# Patient Record
Sex: Female | Born: 1940 | Race: White | Hispanic: No | Smoking: Never smoker
Health system: Southern US, Community
[De-identification: ages and names within clinical notes are randomized; demographics above are authoritative.]

## PROBLEM LIST (undated history)

## (undated) DIAGNOSIS — F419 Anxiety disorder, unspecified: Secondary | ICD-10-CM

## (undated) DIAGNOSIS — I679 Cerebrovascular disease, unspecified: Secondary | ICD-10-CM

## (undated) DIAGNOSIS — M1990 Unspecified osteoarthritis, unspecified site: Secondary | ICD-10-CM

## (undated) DIAGNOSIS — F32A Depression, unspecified: Secondary | ICD-10-CM

## (undated) DIAGNOSIS — C4490 Unspecified malignant neoplasm of skin, unspecified: Secondary | ICD-10-CM

## (undated) DIAGNOSIS — I10 Essential (primary) hypertension: Secondary | ICD-10-CM

## (undated) DIAGNOSIS — C8580 Other specified types of non-Hodgkin lymphoma, unspecified site: Secondary | ICD-10-CM

## (undated) DIAGNOSIS — M51369 Other intervertebral disc degeneration, lumbar region without mention of lumbar back pain or lower extremity pain: Secondary | ICD-10-CM

## (undated) DIAGNOSIS — C8590 Non-Hodgkin lymphoma, unspecified, unspecified site: Secondary | ICD-10-CM

## (undated) DIAGNOSIS — E119 Type 2 diabetes mellitus without complications: Secondary | ICD-10-CM

## (undated) DIAGNOSIS — G250 Essential tremor: Secondary | ICD-10-CM

## (undated) DIAGNOSIS — H3320 Serous retinal detachment, unspecified eye: Secondary | ICD-10-CM

## (undated) DIAGNOSIS — E559 Vitamin D deficiency, unspecified: Secondary | ICD-10-CM

## (undated) DIAGNOSIS — I700 Atherosclerosis of aorta: Secondary | ICD-10-CM

## (undated) DIAGNOSIS — M5136 Other intervertebral disc degeneration, lumbar region: Secondary | ICD-10-CM

## (undated) DIAGNOSIS — G4734 Idiopathic sleep related nonobstructive alveolar hypoventilation: Secondary | ICD-10-CM

## (undated) DIAGNOSIS — E785 Hyperlipidemia, unspecified: Secondary | ICD-10-CM

## (undated) DIAGNOSIS — K5792 Diverticulitis of intestine, part unspecified, without perforation or abscess without bleeding: Secondary | ICD-10-CM

## (undated) DIAGNOSIS — K559 Vascular disorder of intestine, unspecified: Secondary | ICD-10-CM

## (undated) DIAGNOSIS — L719 Rosacea, unspecified: Secondary | ICD-10-CM

## (undated) DIAGNOSIS — R001 Bradycardia, unspecified: Secondary | ICD-10-CM

## (undated) HISTORY — DX: Depression, unspecified: F32.A

## (undated) HISTORY — PX: CATARACT EXTRACTION: SUR2

## (undated) HISTORY — DX: Rosacea, unspecified: L71.9

## (undated) HISTORY — PX: RETINAL DETACHMENT SURGERY: SHX105

## (undated) HISTORY — DX: Type 2 diabetes mellitus without complications: E11.9

## (undated) HISTORY — DX: Atherosclerosis of aorta: I70.0

## (undated) HISTORY — DX: Cerebrovascular disease, unspecified: I67.9

## (undated) HISTORY — DX: Vitamin D deficiency, unspecified: E55.9

## (undated) HISTORY — DX: Idiopathic sleep related nonobstructive alveolar hypoventilation: G47.34

## (undated) HISTORY — DX: Diverticulitis of intestine, part unspecified, without perforation or abscess without bleeding: K57.92

## (undated) HISTORY — DX: Bradycardia, unspecified: R00.1

## (undated) HISTORY — DX: Essential tremor: G25.0

## (undated) HISTORY — DX: Essential (primary) hypertension: I10

## (undated) HISTORY — DX: Unspecified malignant neoplasm of skin, unspecified: C44.90

## (undated) HISTORY — DX: Hyperlipidemia, unspecified: E78.5

## (undated) HISTORY — PX: DILATION AND CURETTAGE OF UTERUS: SHX78

## (undated) HISTORY — DX: Anxiety disorder, unspecified: F41.9

## (undated) HISTORY — DX: Non-Hodgkin lymphoma, unspecified, unspecified site: C85.90

## (undated) HISTORY — DX: Vascular disorder of intestine, unspecified: K55.9

## (undated) HISTORY — DX: Unspecified osteoarthritis, unspecified site: M19.90

## (undated) HISTORY — DX: Other intervertebral disc degeneration, lumbar region without mention of lumbar back pain or lower extremity pain: M51.369

## (undated) HISTORY — DX: Other specified types of non-hodgkin lymphoma, unspecified site: C85.80

## (undated) HISTORY — DX: Serous retinal detachment, unspecified eye: H33.20

## (undated) HISTORY — PX: TONSILLECTOMY AND ADENOIDECTOMY: SHX28

## (undated) HISTORY — DX: Other intervertebral disc degeneration, lumbar region: M51.36

---
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
## 2003-03-02 ENCOUNTER — Encounter: Payer: Self-pay | Admitting: Ophthalmology

## 2003-03-03 ENCOUNTER — Ambulatory Visit (HOSPITAL_COMMUNITY): Admission: RE | Admit: 2003-03-03 | Discharge: 2003-03-03 | Payer: Self-pay | Admitting: Ophthalmology

## 2004-01-05 ENCOUNTER — Ambulatory Visit (HOSPITAL_COMMUNITY): Admission: RE | Admit: 2004-01-05 | Discharge: 2004-01-05 | Payer: Self-pay | Admitting: Ophthalmology

## 2008-09-16 ENCOUNTER — Encounter: Admission: RE | Admit: 2008-09-16 | Discharge: 2008-09-16 | Payer: Self-pay | Admitting: General Surgery

## 2009-10-23 IMAGING — US US ABDOMEN COMPLETE
1 series · 13 of 25 positions shown · non-contrast
Comparison: none

CLINICAL DATA: Abdominal pain; elevated LFTs.  Hypertension.

ABDOMEN ULTRASOUND
TECHNIQUE: Complete abdominal ultrasound examination was performed
including evaluation of the liver, gallbladder, bile ducts,
pancreas, kidneys, spleen, IVC, and abdominal aorta.

[Series 1: us abdomen complete · 0.28mm/px · 13 of 106 slices shown]
[im 1/106]
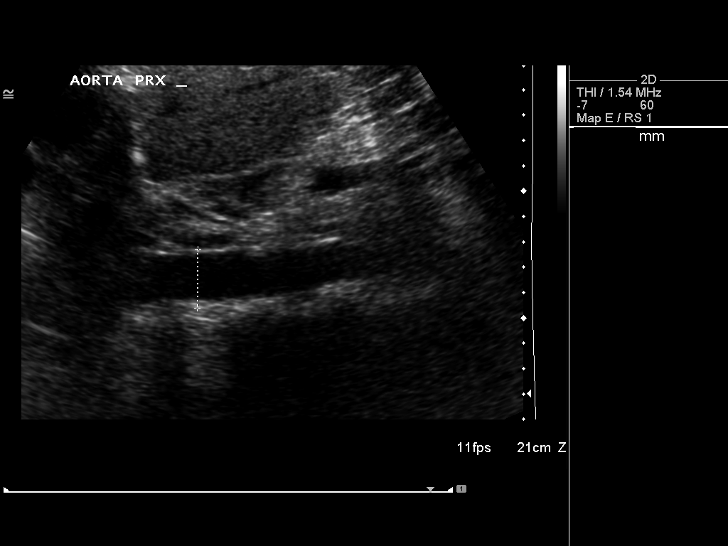
[im 9/106]
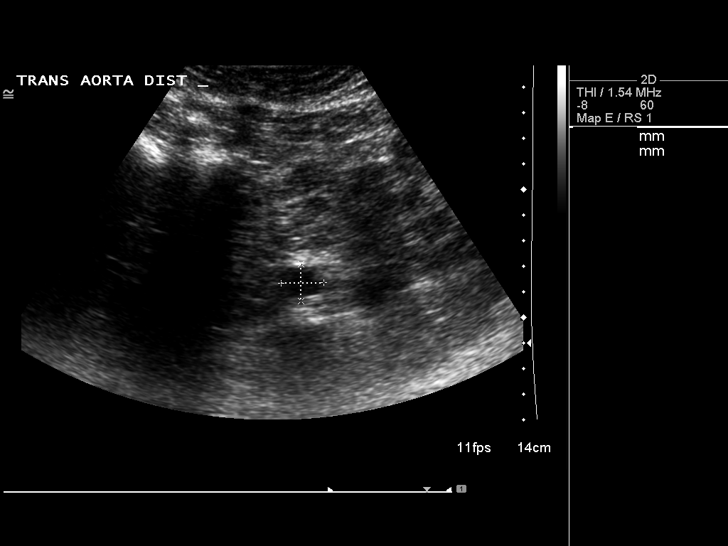
[im 18/106]
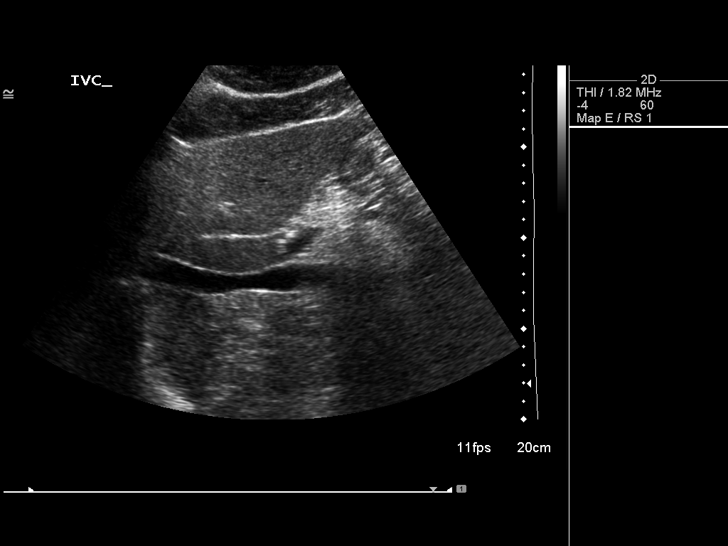
[im 27/106]
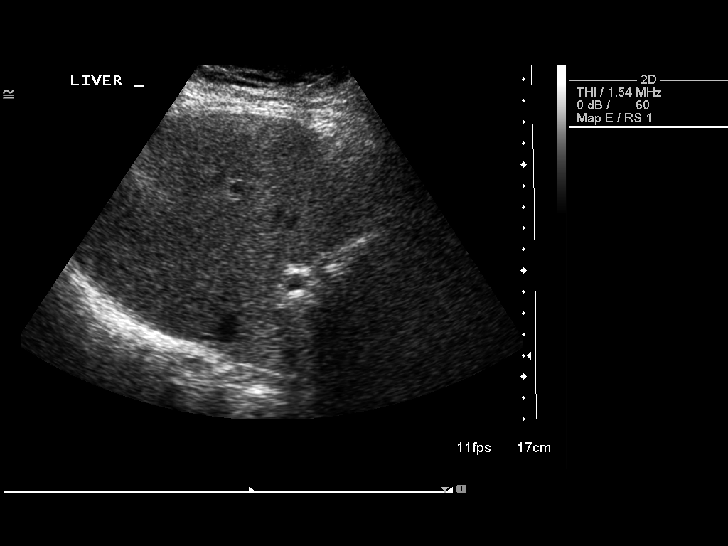
[im 36/106]
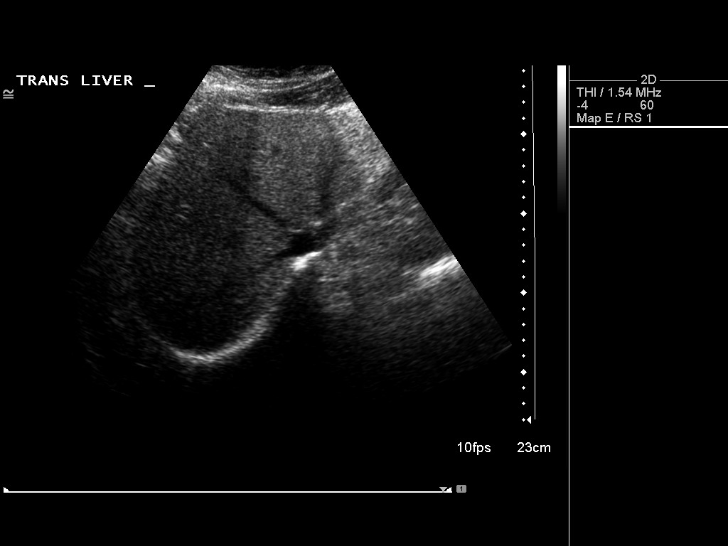
[im 44/106]
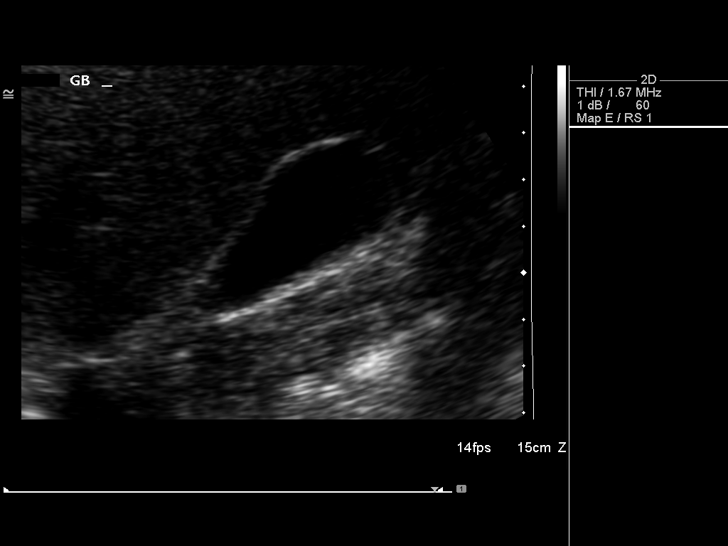
[im 53/106]
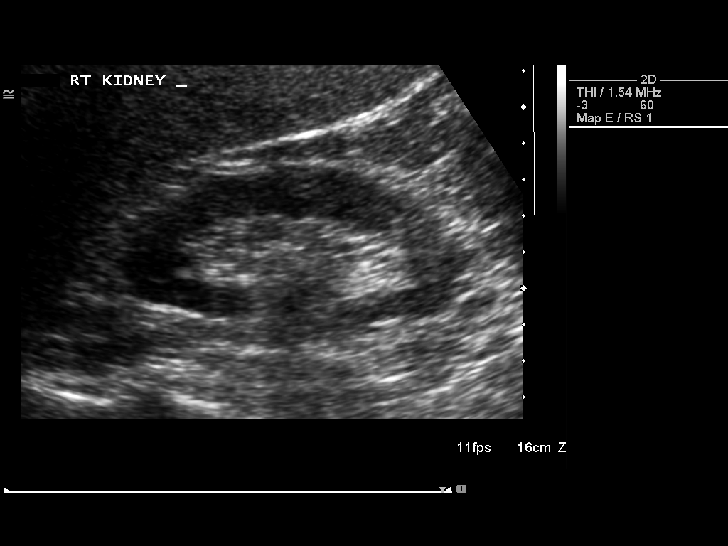
[im 62/106]
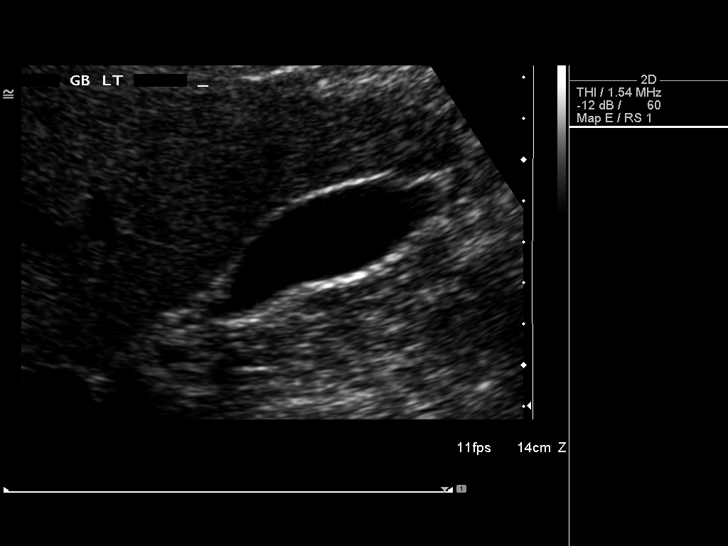
[im 71/106]
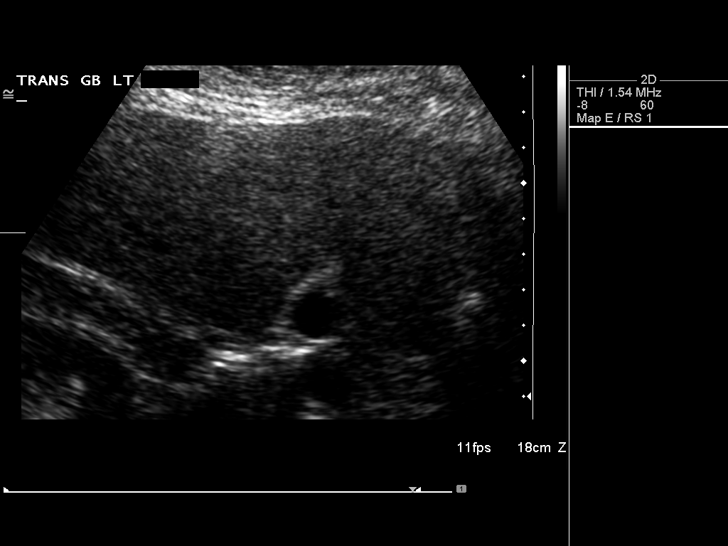
[im 79/106]
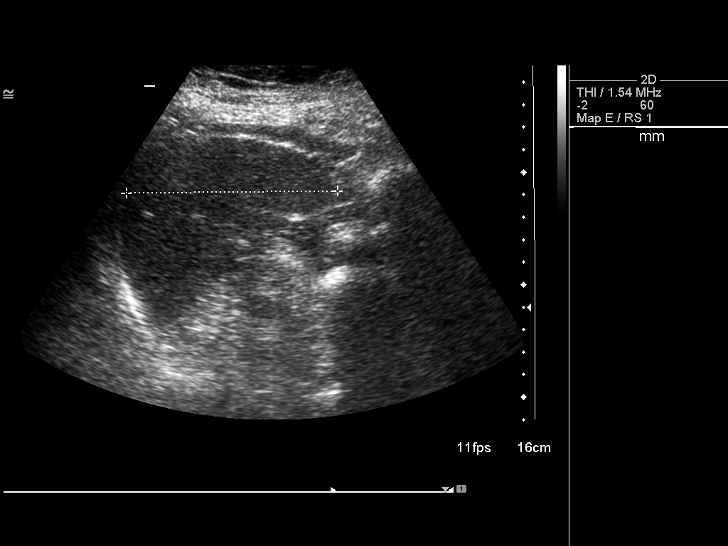
[im 88/106]
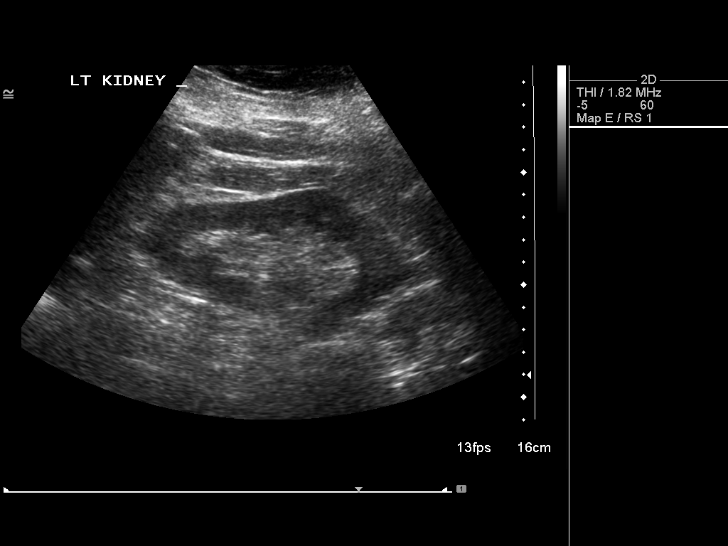
[im 97/106]
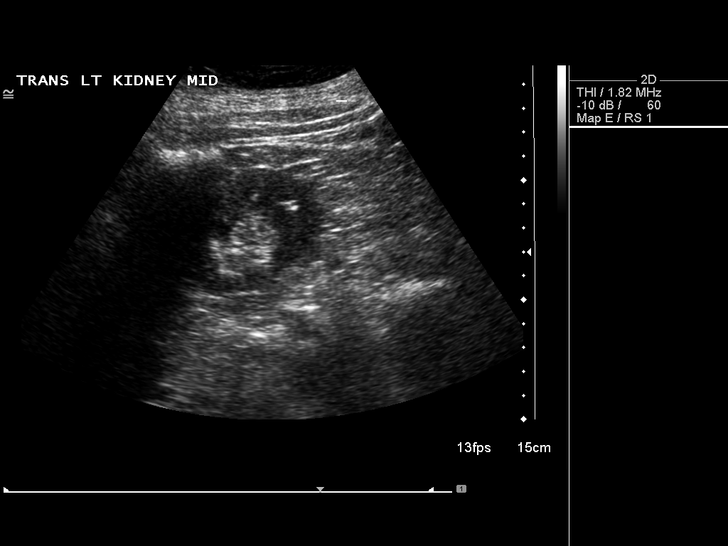
[im 106/106]
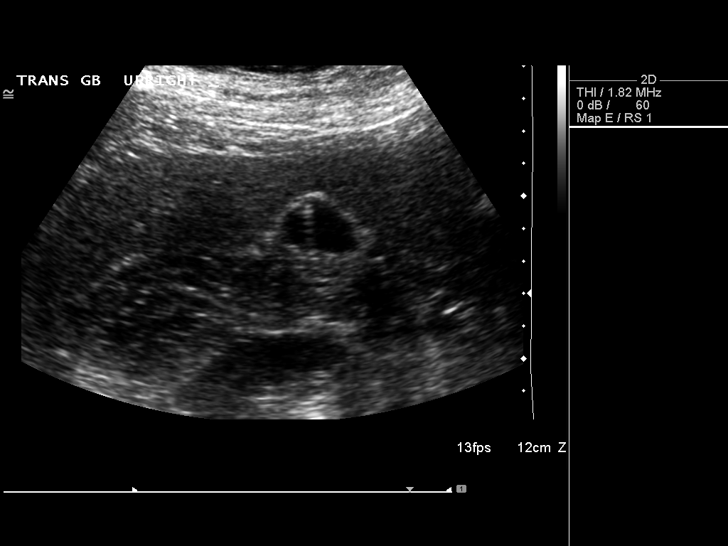

[13 of 25 positions shown; findings below may reference images not displayed]

FINDINGS: No gallstones.  Wall comet tail artifacts are consistent
with adenomyomatosis of the gallbladder.  Airspace septation in the
fundus.  Wall thickness is 1.5 mm.  No biliary ductal dilatation.
Common duct measures 4.4 mm in diameter.  Mild diffuse increase in
hepatic echogenicity is compatible with diffuse hepatocellular
disease.  Slightly limited visualization of the pancreas.  Patent
IVC.  Multiple echogenic foci are noted within the spleen
compatible with granulomata.  Splenic length is 9.4 cm.  The right
and left kidneys measure 9.5 cm and 11.9 cm in length,
respectively.  8 x 8 x 10 mm echogenic focus in the lateral aspect
of the left kidney probably represents a stone.  No hydronephrosis.
The maximum diameter of the abdominal aorta is 2.3 cm.
IMPRESSION: Findings compatible with adenomyomatosis of the gallbladder.
Diffuse hepatocellular disease.  No biliary ductal dilatation.
Probable left renal calculus.

## 2011-01-06 NOTE — Op Note (Signed)
[name], Kerri Garza                             ACCOUNT NO.:  [id number]   MEDICAL RECORD NO.:  [id number]                   PATIENT TYPE:  OIB   LOCATION:  2899                                 FACILITY:  [facility]   PHYSICIAN:  Guadelupe Sabin, M.D.             DATE OF BIRTH:  1941-06-04   DATE OF PROCEDURE:  03/03/2003  DATE OF DISCHARGE:  03/03/2003                                 OPERATIVE REPORT   PREOPERATIVE DIAGNOSIS:  Nuclear cataract left eye.   POSTOPERATIVE DIAGNOSIS:  Nuclear cataract left eye.   OPERATION:  Planned extracapsular cataract extraction--phacoemulsification,  primary insertion of posterior chamber intraocular lens implant.   SURGEON:  Guadelupe Sabin, M.D.   ASSISTANT:  Nurse.   ANESTHESIA:  Local 4% Xylocaine, 0.75 Marcaine retrobulbar block with [place]  added, topical tetracaine, intraocular Xylocaine.  Anesthesia standby  required.  The patient given sodium Pentothal intravenously during the  period of retrobulbar injection.   DESCRIPTION OF PROCEDURE:  After the patient was prepped and draped, the lid  speculum was inserted in the left eye.  The eye was turned downward and a  superior rectus traction suture placed.  A peritomy was performed adjacent  to the limbus from 11 to 1 o'clock position.  The corneoscleral junction was  cleaned and a corneoscleral groove made with a 45 degree Superblade.  The  anterior chamber was then entered with the 2.5 mm diamond keratome at the 12  o'clock position and the 15 degree blade at the 2:30 position.  Using a bent  26 gauge needle on a Healon syringe, a circular capsulorrhexis was begun and  then completed with the Grabow forceps.  Hydrodissection and  hydrodelineation were performed using 1% Xylocaine.  The 30 degrees  phacoemulsification tip was then inserted with slow controlled  emulsification of the lens nucleus.  Total ultrasonic time 30 seconds,  average power levels 16%, total amount of fluid used 85  mL.  Following  removal of the nucleus, the residual cortex was aspirated with the  irrigation aspiration tip.  The posterior capsule appeared intact with a  brilliant red fundus reflex.  Was therefore elected to insert an Aller[employer] SI 40 NB silicone three-piece posterior chamber intraocular  lens implant, diopter strength plus 21.50.  This was inserted with a  McDonald forceps into the anterior chamber and then centered into the  capsular bag using the [facility] lens rotator.  The lens appeared to be well  centered.  The Healon which had been used throughout the procedure was  aspirated and replaced with balanced salt solution and Miochol ophthalmic  solution.  The operative incisions appeared to be self-sealing and no  sutures were required.  Maxitrol ointment was instilled in the conjunctival  cul-de-sac and a light patch and protective shield applied.  Duration of  procedure and anesthesia administration 45 minutes.  The patient tolerated  the  procedure well in general, left the operating room for the recovery room  in good condition.                                               Guadelupe Sabin, M.D.    HNJ/MEDQ  D:  05/01/2003  T:  05/02/2003  Job:  [id number]

## 2011-01-06 NOTE — H&P (Signed)
NAME:  Kerri Garza, Kerri Garza                           ACCOUNT NO.:  [id number]   MEDICAL RECORD NO.:  [id number]                   PATIENT TYPE:  OIB   LOCATION:  2859                                 FACILITY:  [facility]   PHYSICIAN:  Guadelupe Sabin, M.D.             DATE OF BIRTH:  [birth year]   DATE OF ADMISSION:  01/05/2004  DATE OF DISCHARGE:                                HISTORY & PHYSICAL   This was a planned outpatient readmission of this 70 year old female  admitted for cataract implant surgery of the right eye.   HISTORY OF PRESENT ILLNESS:  This patient had previous cataract implant  surgery of the left eye performed on March 03, 2003.  The patient did well  following this surgery and now, due to blurred vision, is ready to proceed  with similar surgery of the unoperated right eye.  She was given oral  discussion and printed information concerning the procedure and its possible  complications. She signed an informed consent, and arrangements were made  for outpatient admission at this time.   PAST MEDICAL HISTORY:  See old chart.   FAMILY HISTORY:  Husband is very sick at the present time.   CURRENT MEDICATIONS:  Aspirin.   PRIMARY CARE PHYSICIAN:  Dr. Malva Limes, [place], [place].   REVIEW OF SYSTEMS:  No cardiorespiratory complaints.   PHYSICAL EXAMINATION:  VITAL SIGNS:  As recorded on admission, blood  pressure 150/81, temperature 97.3, pulse 75, respirations 16.  GENERAL:  The patient is a pleasant, well-nourished, well-developed, 62-year-  old white female in no acute distress.  HEENT:  Eyes:  Visual acuity without correction 20/400 right eye, 20/25 +2  left eye.  Applanation tonometry 12 mm right eye, 20 left eye.  Slit lamp  examination:  The eyes are white and clear with nuclear cataract formation  in the right eye.  The left eye shows a posterior chamber intraocular lens  implant.  The posterior capsule is slightly hazy.  Detailed fundus  examination dilated  shows cataract haze in the right eye, otherwise a clear  vitreous, attached retina, with normal optic nerve, blood vessels, and  macula.   ADMISSION DIAGNOSES:  1. Senile cataract, right eye.  2. Pseudophakia, left eye.   SURGICAL PLAN:  Cataract implant surgery, right eye, with intraocular lens  implantation.                                                Guadelupe Sabin, M.D.    HNJ/MEDQ  D:  01/05/2004  T:  01/05/2004  Job:  [id number]   cc:   Dr. Malva Limes, [facility]

## 2011-01-06 NOTE — Op Note (Signed)
NAME:  Kerri Garza, Kerri Garza                           ACCOUNT NO.:  [id number]   MEDICAL RECORD NO.:  [id number]                   PATIENT TYPE:  OIB   LOCATION:  2859                                 FACILITY:  [facility]   PHYSICIAN:  Guadelupe Sabin, M.D.             DATE OF BIRTH:  [birth year]   DATE OF PROCEDURE:  01/05/2004  DATE OF DISCHARGE:                                 OPERATIVE REPORT   PREOPERATIVE DIAGNOSIS:  Senile cataract, right eye.   POSTOPERATIVE DIAGNOSIS:  Senile cataract, right eye.   OPERATION:  Planned extracapsular cataract extraction --  phacoemulsification, primary insertion of posterior chamber intraocular lens  implant.   SURGEON:  Guadelupe Sabin, M.D.   ASSISTANT:  Nurse.   ANESTHESIA:  Local 4% Xylocaine, 0.75 Marcaine, retrobulbar block, topical  tetracaine, intraocular Xylocaine.  Anesthesia standby required.  Patient  given sodium Pentothal or Ditropan intravenously during the period of  retrobulbar blocking.   DESCRIPTION OF PROCEDURE:  After the patient was prepped and draped, a lid  speculum was inserted in the right eye.  The eye was turned downward and a  superior rectus traction suture placed.  Schiotz tonometry was recorded at 5  scale units with a 5.5 g weight.  A peritomy was performed adjacent to the  limbus from the 11 to 1 o'clock position.  The corneoscleral junction was  cleaned and a corneoscleral groove made with a 45 degree Superblade.  The  anterior chamber was then entered with the 2.5 mm diamond keratome at the 12  o'clock position and the 15 degree blade at the 2:30 position.  Using a bent  26-gauge needle on a Healon syringe, a circular capsulorhexis was begun and  then completed with the Grabow forceps.  Hydrodissection and  hydrodelineation were performed using 1% Xylocaine.  The 30-degree  phacoemulsification tip was then inserted with slow, controlled  emulsification with lens nucleus.  Total ultrasonic time: 44 seconds.  Average power level: 12%.  Total amount of fluid used: 40 ml.  Following  removal of the nucleus, the residual cortex was aspirated with the  irrigation aspiration tip.  The posterior capsule appeared intact with a  brilliant red fundus reflex.  It was, therefore, elected to insert an  Aller[employer] Medical Optics [id number] silicone three-piece posterior chamber  intraocular lens implant, diopter strength +21.50.  This was inserted with  the McDonald forceps into the anterior chamber and then centered into the  capsular bag using the [facility] rotator.  The lens appeared to be well  centered.  The Healon and [place], which had been used intermittently  during the procedure, was aspirated and replaced with balanced salt solution  and Miochol ophthalmic solution.  A single 10-0 nylon suture was used to  close the incision at the 12 o'clock position to ensure closure and prevent  endophthalmitis.  Maxitrol ointment was instilled in the conjunctival cul-de-  sac and  a  light patch and protector shield applied.  Duration of procedure  and anesthesia administration: 45 minutes.  The patient tolerated the  procedure well in general and left the operating room for the recovery room  in good condition.                                               Guadelupe Sabin, M.D.    HNJ/MEDQ  D:  01/05/2004  T:  01/05/2004  Job:  [id number]

## 2011-01-06 NOTE — H&P (Signed)
Kerri Garza, Kerri Garza                             ACCOUNT NO.:  [id number]   MEDICAL RECORD NO.:  [id number]                   PATIENT TYPE:  OIB   LOCATION:  2899                                 FACILITY:  [facility]   PHYSICIAN:  Guadelupe Sabin, M.D.             DATE OF BIRTH:  1941-07-09   DATE OF ADMISSION:  03/03/2003  DATE OF DISCHARGE:  03/03/2003                                HISTORY & PHYSICAL   IDENTIFYING INFORMATION:  This was a planned outpatient surgical admission  of this 70 year old white female admitted for cataract implant surgery of  the left eye.   HISTORY OF PRESENT ILLNESS:  This patient was first seen in my office on  October 13, 1998 complaining of watery vision and glare.  Examination  revealed early nuclear cataract formation in both eyes.  When the patient  returned on December 04, 2002, vision had deteriorated significantly to 20/70  to 20/80 right eye and 20/100 to 20/300 left eye with a refraction and her  old glasses.  Slit-lamp examination showed marked progression in her  cataract formation.  A [place] potential visual acuity reading of 20/20 right  eye, 20/30 left eye indicated that the patient would have probably improved  vision with cataract surgery.  The patient was given oral discussion and  printed information concerning the procedure and its possible complications.  She signed an informed consent and arrangements were made for his outpatient  admission at this time.   PAST MEDICAL HISTORY:  The patient is in stable general health under the  care of her [facility] physicians.  She takes multivitamins and one aspirin  per day.   REVIEW OF SYSTEMS:  No cardiorespiratory complaints.   ALLERGIES:  No known allergies.   PHYSICAL EXAMINATION:  VITAL SIGNS:  As recorded on admission, blood  pressure 137/72, temperature 97.8, heart rate 71, respirations 20.  GENERAL APPEARANCE:  The patient is a pleasant well-nourished, well-  developed white female in  no acute distress.  HEENT:  Eyes:  Visual acuity as noted above.  Applanation tonometry normal  17 mm each eye.  Slit-lamp examination:  The eyes are white and clear with a  clear cornea deep in clear anterior chamber.  Nuclear cataract formation is  present in both eyes.  Detailed dilated fundus examination reveals a clear  vitreous attached retina with normal optic nerve, blood vessels, and macula.  CHEST:  Lungs clear to percussion/auscultation .  HEART:  Normal sinus rhythm.  No cardiomegaly.  No murmurs.  ABDOMEN:  Negative.  EXTREMITIES:  Negative.    ADMISSION DIAGNOSIS:  Senile nuclear cataract both eyes.   SURGICAL PLAN:  Cataract implant surgery left eye now, right eye later.  Guadelupe Sabin, M.D.    HNJ/MEDQ  D:  05/01/2003  T:  05/02/2003  Job:  [id number]

## 2011-08-24 DIAGNOSIS — H33009 Unspecified retinal detachment with retinal break, unspecified eye: Secondary | ICD-10-CM | POA: Insufficient documentation

## 2011-08-24 DIAGNOSIS — Z9889 Other specified postprocedural states: Secondary | ICD-10-CM | POA: Insufficient documentation

## 2011-12-07 DIAGNOSIS — D3130 Benign neoplasm of unspecified choroid: Secondary | ICD-10-CM | POA: Insufficient documentation

## 2011-12-07 DIAGNOSIS — H3320 Serous retinal detachment, unspecified eye: Secondary | ICD-10-CM | POA: Insufficient documentation

## 2012-12-12 DIAGNOSIS — H35379 Puckering of macula, unspecified eye: Secondary | ICD-10-CM | POA: Insufficient documentation

## 2014-12-21 ENCOUNTER — Other Ambulatory Visit (HOSPITAL_COMMUNITY)
Admission: RE | Admit: 2014-12-21 | Discharge: 2014-12-21 | Disposition: A | Discharge status: Home / Self Care | Payer: Medicare Other | Source: Ambulatory Visit | Attending: Oncology | Admitting: Oncology

## 2014-12-21 DIAGNOSIS — D72829 Elevated white blood cell count, unspecified: Secondary | ICD-10-CM | POA: Diagnosis present

## 2015-11-15 ENCOUNTER — Ambulatory Visit: Payer: Self-pay | Admitting: Podiatry

## 2015-11-17 ENCOUNTER — Ambulatory Visit (INDEPENDENT_AMBULATORY_CARE_PROVIDER_SITE_OTHER): Payer: Medicare Other

## 2015-11-17 ENCOUNTER — Encounter: Payer: Self-pay | Admitting: Sports Medicine

## 2015-11-17 ENCOUNTER — Ambulatory Visit (INDEPENDENT_AMBULATORY_CARE_PROVIDER_SITE_OTHER): Payer: Medicare Other | Admitting: Sports Medicine

## 2015-11-17 DIAGNOSIS — M79672 Pain in left foot: Secondary | ICD-10-CM

## 2015-11-17 DIAGNOSIS — M2040 Other hammer toe(s) (acquired), unspecified foot: Secondary | ICD-10-CM

## 2015-11-17 DIAGNOSIS — L84 Corns and callosities: Secondary | ICD-10-CM | POA: Diagnosis not present

## 2015-11-17 NOTE — Progress Notes (Signed)
Patient ID: Kerri Garza, female   DOB: [birth year], 75 y.o.   MRN: [id number] Subjective: Kerri Garza is a 75 y.o. female patient who presents to office for evaluation of Left foot pain. Patient complains of pain at the lesion present Left foot at the 5th toe with radiation sometimes over the top of the toe. Patient has tried guaze spacer and changing shoes with relief in symptoms. Patient states that about 1.5 years ago she was told it was a cyst and was wondering if this was true. Patient denies any other pedal complaints.   Patient Active Problem List   Diagnosis Date Noted  . 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 History of surgical procedure 08/24/2011    No current outpatient prescriptions on file prior to visit.   No current facility-administered medications on file prior to visit.    Allergies  Allergen Reactions  . Moxifloxacin Other (See Comments)    Eye irritation    Objective:  General: Alert and oriented x3 in no acute distress  Dermatology: Keratotic lesion at lateral left medial 5th toe with central nucleated core noted, no webspace macerations, no ecchymosis bilateral, all nails x 10 are well manicured.  Vascular: Dorsalis Pedis and Posterior Tibial pedal pulses 1/4, Capillary Fill Time 3 seconds, no pedal hair growth bilateral, no edema bilateral lower extremities, mild  varicosities, Temperature gradient within normal limits.  Neurology: Johney Maine sensation intact via light touch bilateral.  Musculoskeletal: Mild tenderness with palpation at the lesion site on Left 5th toe with lesser hammertoe and varus rotation, Muscular strength 5/5 in all groups without pain or limitation on range of motion.  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 No other acute findings.   Assessment and Plan: Problem List Items Addressed This Visit    None    Visit Diagnoses    Left foot pain    -  Primary    Relevant Orders    DG Foot 2 Views Left    Corns and callosities        Hammer toe, unspecified laterality           -Complete examination performed -Xrays reviewed -Discussed treatment options for callus at medial left 5th toe secondary to varus hammertoe -Parred keratoic lesion using a chisel blade without incident. Gave patient toe spacer and instructed on use. Patient to wear good supportive shoes for foot type. Advised patient if area becomes inflamed may consider local steroid shot; at this time not indicated.  -Patient to return to office as needed or sooner if condition worsens.  Landis Martins, DPM

## 2015-12-09 DIAGNOSIS — L84 Corns and callosities: Secondary | ICD-10-CM

## 2016-01-31 DIAGNOSIS — C884 Extranodal marginal zone B-cell lymphoma of mucosa-associated lymphoid tissue [MALT-lymphoma]: Secondary | ICD-10-CM | POA: Diagnosis not present

## 2017-03-05 DIAGNOSIS — I10 Essential (primary) hypertension: Secondary | ICD-10-CM | POA: Diagnosis not present

## 2017-03-05 DIAGNOSIS — C884 Extranodal marginal zone B-cell lymphoma of mucosa-associated lymphoid tissue [MALT-lymphoma]: Secondary | ICD-10-CM

## 2018-03-27 DIAGNOSIS — C884 Extranodal marginal zone B-cell lymphoma of mucosa-associated lymphoid tissue [MALT-lymphoma]: Secondary | ICD-10-CM

## 2018-03-27 DIAGNOSIS — R030 Elevated blood-pressure reading, without diagnosis of hypertension: Secondary | ICD-10-CM | POA: Diagnosis not present

## 2018-07-22 ENCOUNTER — Other Ambulatory Visit: Payer: Self-pay | Admitting: *Deleted

## 2018-07-22 DIAGNOSIS — R42 Dizziness and giddiness: Secondary | ICD-10-CM

## 2018-08-26 ENCOUNTER — Ambulatory Visit (INDEPENDENT_AMBULATORY_CARE_PROVIDER_SITE_OTHER): Payer: Medicare Other

## 2018-08-26 DIAGNOSIS — R42 Dizziness and giddiness: Secondary | ICD-10-CM

## 2018-08-30 ENCOUNTER — Telehealth: Payer: Self-pay | Admitting: *Deleted

## 2018-08-30 NOTE — Telephone Encounter (Signed)
Faxed 48 HR holter monitor report to Dr. Felipa Emory office.

## 2018-10-08 ENCOUNTER — Telehealth: Payer: Self-pay | Admitting: *Deleted

## 2018-10-08 NOTE — Telephone Encounter (Signed)
Pt had called to say got a bill from [employer] for her 48 hr monitor and the company said they did not have her insurance information. Called [employer] and gave them her insurance information and they stated would file the monitor with pt's insurance. Advised pt and apologized for the inconvenience.

## 2018-10-10 ENCOUNTER — Telehealth: Payer: Self-pay | Admitting: *Deleted

## 2018-10-10 NOTE — Telephone Encounter (Signed)
Faxed monitor report to Dr. Laqueta Due, ordering provider.

## 2018-10-17 ENCOUNTER — Ambulatory Visit: Payer: Medicare Other | Admitting: Neurology

## 2018-11-14 ENCOUNTER — Ambulatory Visit: Payer: Medicare Other | Admitting: Neurology

## 2019-01-08 ENCOUNTER — Telehealth: Payer: Self-pay | Admitting: *Deleted

## 2019-01-08 ENCOUNTER — Other Ambulatory Visit: Payer: Self-pay

## 2019-01-08 ENCOUNTER — Ambulatory Visit (INDEPENDENT_AMBULATORY_CARE_PROVIDER_SITE_OTHER): Payer: Medicare Other | Admitting: Neurology

## 2019-01-08 ENCOUNTER — Encounter: Payer: Self-pay | Admitting: Neurology

## 2019-01-08 VITALS — HR 78 | Temp 98.0°F | Ht 62.0 in | Wt 187.0 lb

## 2019-01-08 DIAGNOSIS — G1221 Amyotrophic lateral sclerosis: Secondary | ICD-10-CM

## 2019-01-08 DIAGNOSIS — R269 Unspecified abnormalities of gait and mobility: Secondary | ICD-10-CM | POA: Diagnosis not present

## 2019-01-08 DIAGNOSIS — E538 Deficiency of other specified B group vitamins: Secondary | ICD-10-CM

## 2019-01-08 DIAGNOSIS — W19XXXA Unspecified fall, initial encounter: Secondary | ICD-10-CM

## 2019-01-08 DIAGNOSIS — G1229 Other motor neuron disease: Secondary | ICD-10-CM

## 2019-01-08 DIAGNOSIS — R413 Other amnesia: Secondary | ICD-10-CM

## 2019-01-08 DIAGNOSIS — G4733 Obstructive sleep apnea (adult) (pediatric): Secondary | ICD-10-CM | POA: Diagnosis not present

## 2019-01-08 DIAGNOSIS — M4712 Other spondylosis with myelopathy, cervical region: Secondary | ICD-10-CM

## 2019-01-08 DIAGNOSIS — R2689 Other abnormalities of gait and mobility: Secondary | ICD-10-CM | POA: Diagnosis not present

## 2019-01-08 DIAGNOSIS — R270 Ataxia, unspecified: Secondary | ICD-10-CM

## 2019-01-08 NOTE — Telephone Encounter (Signed)
I called Dr. Felipa Emory office and spoke with Denman George. I requested the latest sleep study/notes/exam they have to be faxed over to our office per Dr. Cathren Laine request. They do not have any records for sleep. She stated to try [facility] as it looks like she has seen them for sleep 2012. I called [facility] and spoke with Vicente Males. She said all they had was a referral from 2007 from [place] but they never had the actual consult.

## 2019-01-08 NOTE — Telephone Encounter (Signed)
Thanks, I have Kerri Garza on it too.

## 2019-01-08 NOTE — Patient Instructions (Addendum)
Sleep Evaluation: We will call you to schedule and appointment with our sleep doctor Dr. Brett Fairy MRI of the cervical spine due to imbalance Blood work today May consider formal memory testing in the future  Memory Compensation Strategies  1. Use "WARM" strategy.  W= write it down  A= associate it  R= repeat it  M= make a mental note  2.   You can keep a Social worker.  Use a 3-ring notebook with sections for the following: calendar, important names and phone numbers,  medications, doctors' names/phone numbers, lists/reminders, and a section to journal what you did  each day.   3.    Use a calendar to write appointments down.  4.    Write yourself a schedule for the day.  This can be placed on the calendar or in a separate section of the Memory Notebook.  Keeping a  regular schedule can help memory.  5.    Use medication organizer with sections for each day or morning/evening pills.  You may need help loading it  6.    Keep a basket, or pegboard by the door.  Place items that you need to take out with you in the basket or on the pegboard.  You may also want to  include a message board for reminders.  7.    Use sticky notes.  Place sticky notes with reminders in a place where the task is performed.  For example: " turn off the  stove" placed by the stove, "lock the door" placed on the door at eye level, " take your medications" on  the bathroom mirror or by the place where you normally take your medications.  8.    Use alarms/timers.  Use while cooking to remind yourself to check on food or as a reminder to take your medicine, or as a  reminder to make a call, or as a reminder to perform another task, etc.   Sleep Apnea Sleep apnea affects breathing during sleep. It causes breathing to stop for a short time or to become shallow. It can also increase the risk of:  Heart attack.  Stroke.  Being very overweight (obese).  Diabetes.  Heart failure.  Irregular  heartbeat. The goal of treatment is to help you breathe normally again. What are the causes? There are three kinds of sleep apnea:  Obstructive sleep apnea. This is caused by a blocked or collapsed airway.  Central sleep apnea. This happens when the brain does not send the right signals to the muscles that control breathing.  Mixed sleep apnea. This is a combination of obstructive and central sleep apnea. The most common cause of this condition is a collapsed or blocked airway. This can happen if:  Your throat muscles are too relaxed.  Your tongue and tonsils are too large.  You are overweight.  Your airway is too small. What increases the risk?  Being overweight.  Smoking.  Having a small airway.  Being older.  Being female.  Drinking alcohol.  Taking medicines to calm yourself (sedatives or tranquilizers).  Having family members with the condition. What are the signs or symptoms?  Trouble staying asleep.  Being sleepy or tired during the day.  Getting angry a lot.  Loud snoring.  Headaches in the morning.  Not being able to focus your mind (concentrate).  Forgetting things.  Less interest in sex.  Mood swings.  Personality changes.  Feelings of sadness (depression).  Waking up a lot during the night to  pee (urinate).  Dry mouth.  Sore throat. How is this diagnosed?  Your medical history.  A physical exam.  A test that is done when you are sleeping (sleep study). The test is most often done in a sleep lab but may also be done at home. How is this treated?   Sleeping on your side.  Using a medicine to get rid of mucus in your nose (decongestant).  Avoiding the use of alcohol, medicines to help you relax, or certain pain medicines (narcotics).  Losing weight, if needed.  Changing your diet.  Not smoking.  Using a machine to open your airway while you sleep, such as: ? An oral appliance. This is a mouthpiece that shifts your lower  jaw forward. ? A CPAP device. This device blows air through a mask when you breathe out (exhale). ? An EPAP device. This has valves that you put in each nostril. ? A BPAP device. This device blows air through a mask when you breathe in (inhale) and breathe out.  Having surgery if other treatments do not work. It is important to get treatment for sleep apnea. Without treatment, it can lead to:  High blood pressure.  Coronary artery disease.  In men, not being able to have an erection (impotence).  Reduced thinking ability. Follow these instructions at home: Lifestyle  Make changes that your doctor recommends.  Eat a healthy diet.  Lose weight if needed.  Avoid alcohol, medicines to help you relax, and some pain medicines.  Do not use any products that contain nicotine or tobacco, such as cigarettes, e-cigarettes, and chewing tobacco. If you need help quitting, ask your doctor. General instructions  Take over-the-counter and prescription medicines only as told by your doctor.  If you were given a machine to use while you sleep, use it only as told by your doctor.  If you are having surgery, make sure to tell your doctor you have sleep apnea. You may need to bring your device with you.  Keep all follow-up visits as told by your doctor. This is important. Contact a doctor if:  The machine that you were given to use during sleep bothers you or does not seem to be working.  You do not get better.  You get worse. Get help right away if:  Your chest hurts.  You have trouble breathing in enough air.  You have an uncomfortable feeling in your back, arms, or stomach.  You have trouble talking.  One side of your body feels weak.  A part of your face is hanging down. These symptoms may be an emergency. Do not wait to see if the symptoms will go away. Get medical help right away. Call your local emergency services (911 in the [place] Do not drive yourself to the  hospital. Summary  This condition affects breathing during sleep.  The most common cause is a collapsed or blocked airway.  The goal of treatment is to help you breathe normally while you sleep. This information is not intended to replace advice given to you by your health care provider. Make sure you discuss any questions you have with your health care provider. Document Released: 05/16/2008 Document Revised: 04/02/2018 Document Reviewed: 04/02/2018 Elsevier Interactive Patient Education  [employer].

## 2019-01-08 NOTE — Progress Notes (Signed)
[facility]    Provider:  Dr Jaynee Eagles Requesting Provider: Ernestene Kiel, MD Primary Care Provider:  Ernestene Kiel, MD  CC:  Memory loss, imbalance  HPI:  Kerri Garza is a 78 y.o. female here as requested by Ernestene Kiel, MD for memory loss, imbalance. She has a PMHx of essential hypertension, essential tremor, hyperlipidemia, type 2 diabetes, osteoarthritis of the right knee, obstructive sleep apnea (not on CPAP), unsteady gait, obesity, anxiety, mild depression, cerebrovascular small vessel disease, vitamin D deficiency.. She has not felt well for several years. Dr. Laqueta Due has run "every conceivable test on me". She had her blood-flow in her legs checked, MRI of the brain. She has essential tremor and it can be so bad she can't even write. But her main concern for neurology is her memory, but some mornings she doesn't feel as good. She is not exercising. When she sits sometimes, she wants to think of something and she feels her thinking is slow, can;t think of a person's name but she eventually remembers it. She has untreated sleep apnea, she saw someone on [place]. She takes a nap every day. She feels fatigued sometimes. It is hard for her to get up in the mornings, she does not feel refreshed. She also has depression, there is a lot of things going on with her house, things are on her mind. Her maternal grandmother had dementia but unclear. Mother had a stroke and could not talk so unclear about her mentation. Father had Parkinson's Disease, died at the ages of late 63s. She is an only child. Also balance is poor. No numbness or tingling in the feet. She has problems with her right knee, it needs to be replaced and so she has problems with stairs due to this. She has fallen twice but it was mechanical, she slipped on her deck. She may feel light headed but she doesn't really know cant give me examples, she has never felt pre-syncope or syncope, denies vertigo.  Husband is deceased, lives by herself , she pays her own bills, she never misses bills, she cooks and cleans. She does not eat properly. She shops, she never gets lost. No one has noticed that she has any memory problems, she does not repeat or ask same questions over and over. She calls people from her church, she does not find herself to be too depressed she just feels sad sometimes and overwhelmed with tasks in her home but she lives alone.   Reviewed notes, labs and imaging from outside physicians, which showed:  CBC with differential was normal, CMP was also normal, BUN 16, creatinine 0.85 August 29, 2018.  Review of Systems: Patient complains of symptoms per HPI as well as the following symptoms: joint pain, imbalance, falls, memory loss. Pertinent negatives and positives per HPI. All others negative.   Social History   Socioeconomic History  . Marital status: Widowed    Spouse name: Not on file  . Number of children: 2  . Years of education: 66  . Highest education level: Bachelor's degree (e.g., BA, AB, BS)  Occupational History  . Not on file  Social Needs  . Financial resource strain: Not on file  . Food insecurity:    Worry: Not on file    Inability: Not on file  . Transportation needs:    Medical: Not on file    Non-medical: Not on file  Tobacco Use  . Smoking status: Never Smoker  . Smokeless tobacco: Never Used  Substance  and Sexual Activity  . Alcohol use: Not Currently    Alcohol/week: 0.0 standard drinks    Comment: wine in past, none in years  . Drug use: Never  . Sexual activity: Not on file  Lifestyle  . Physical activity:    Days per week: Not on file    Minutes per session: Not on file  . Stress: Not on file  Relationships  . Social connections:    Talks on phone: Not on file    Gets together: Not on file    Attends religious service: Not on file    Active member of club or organization: Not on file    Attends meetings of clubs or organizations:  Not on file    Relationship status: Not on file  . Intimate partner violence:    Fear of current or ex partner: Not on file    Emotionally abused: Not on file    Physically abused: Not on file    Forced sexual activity: Not on file  Other Topics Concern  . Not on file  Social History Narrative   Lives at home alone   Right handed    Family History  Problem Relation Age of Onset  . High blood pressure Mother   . Stroke Mother   . Parkinson's disease Father   . Dementia Maternal Grandmother   . Cancer Maternal Grandfather        thinks it was pancreatic  . Heart disease Paternal Grandmother   . Suicidality Paternal Grandfather     Past Medical History:  Diagnosis Date  . Lymphoma ([place])    she was diagnosed several years ago for about 2.5 years but then labs were completely normal since then. Received no treatment.  . Retinal detachment    bilateral    Patient Active Problem List   Diagnosis Date Noted  . 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 History of surgical procedure 08/24/2011    Past Surgical History:  Procedure Laterality Date  . CATARACT EXTRACTION Bilateral   . TONSILLECTOMY  1947    Current Outpatient Medications  Medication Sig Dispense Refill  . aspirin 325 MG tablet Take 325 mg by mouth daily.     . Coenzyme Q10 (COQ10) 100 MG CAPS Take 200 mg by mouth daily.    . Ergocalciferol (VITAMIN D2 PO) Take 2,000 Units by mouth daily.     Marland Kitchen FIBER PO Take 2 each by mouth. Fiber well gummies 5 gram each    . fluticasone (FLONASE) 50 MCG/ACT nasal spray Place into both nostrils.    . metroNIDAZOLE (METROCREAM) 0.75 % cream Apply 1 application topically daily.    Marland Kitchen nystatin (MYCOSTATIN/NYSTOP) powder Apply topically as needed. Under breasts    . Omega-3 Fatty Acids (FISH OIL) 1200 MG CAPS Take by mouth.    . TURMERIC CURCUMIN PO Take by mouth.     Marland Kitchen UNABLE TO FIND Take 500 mg by mouth daily. Med Name: veggie caps by healthy origins    . UNKNOWN TO PATIENT Medication she takes as needed for rosacea    . spironolactone (ALDACTONE) 25 MG tablet Take 6.25 mg by mouth daily as needed (swelling).    Kerri Garza 625 MG tablet TAKE THREE TABLETS TWICE DAILY  4   No current facility-administered medications for this visit.     Allergies as of 01/08/2019 - Review Complete 01/08/2019  Allergen Reaction  Noted  . Bactrim ds [sulfamethoxazole-trimethoprim]  01/08/2019  . Lisinopril  01/08/2019  . Losartan potassium  01/08/2019  . Metoprolol  01/08/2019  . Moxifloxacin Other (See Comments) 11/17/2015  . Propranolol  01/08/2019  . Ranitidine hcl  01/08/2019    Vitals: Pulse 78   Temp 98 F (36.7 C) Comment: taken by staff at front upon arrival  Ht 5\' 2"  (1.575 m)   Wt 187 lb (84.8 kg)   LMP  (LMP Unknown)   BMI 34.20 kg/m  Last Weight:  Wt Readings from Last 1 Encounters:  01/08/19 187 lb (84.8 kg)   Last Height:   Ht Readings from Last 1 Encounters:  01/08/19 5\' 2"  (1.575 m)     Physical exam: Exam: Gen: NAD, conversant, well nourised, obese, well groomed                     CV: RRR, no MRG. No Carotid Bruits. No peripheral edema, warm, nontender Eyes: Conjunctivae clear without exudates or hemorrhage  Neuro: Detailed Neurologic Exam  Speech:    Speech is normal; fluent and spontaneous with normal comprehension.  Cognition:  MMSE - Mini Mental State Exam 01/08/2019  Orientation to time 5  Orientation to Place 5  Registration 3  Attention/ Calculation 5  Recall 3  Language- name 2 objects 2  Language- repeat 1  Language- follow 3 step command 3  Language- read & follow direction 1  Write a sentence 1  Copy design 1  Total score 30       The patient is oriented to person, place, and time;     recent and remote memory intact;     language fluent;     normal attention, concentration,     fund of  knowledge Cranial Nerves:    The pupils are equal, round, and reactive to light. Attempted could not visualize fundi due to small pupils. Visual fields are full to finger confrontation. Extraocular movements are intact. Trigeminal sensation is intact and the muscles of mastication are normal. The face is symmetric. The palate elevates in the midline. Hearing intact. Voice is normal. Shoulder shrug is normal. The tongue has normal motion without fasciculations.   Coordination:    Normal finger to nose and heel to shin. Normal rapid alternating movements.   Gait:     Mildly antalgic and right eversion of the foot but good arm swing, normal stance, slightly decreased stride, not shuffling, not ataxic.   Motor Observation:    No asymmetry, no atrophy, high amp postural and action tremor and headtremor Tone:    Normal muscle tone.    Posture:    Posture is normal. normal erect    Strength: Mild LE prox weakness but symmetric. Otherwise strength is V/V in the upper and lower limbs.      Sensation: intact to LT     Reflex Exam:  DTR's:   Hypo AJs otherwise dDeep tendon reflexes in the upper and lower extremities are brisk bilaterally.   Toes:     Right toe upgoing Clonus:    Clonus is absent.    Assessment/Plan:  MALAYAH DEMURO is a 78 y.o. female here as requested by Ernestene Kiel, MD for memory loss, imbalance. She has a PMHx of essential hypertension, essential tremor, hyperlipidemia, type 2 diabetes, osteoarthritis of the right knee, obstructive sleep apnea (not on CPAP), unsteady gait, obesity, anxiety, mild depression, cerebrovascular small vessel disease, vitamin D deficiency.  MRI of the brain was largely  unremarkable with mild to moderate chronic microvascular ischemia which is not out of proportion to her age and vascular risk factors (see above).    Memory loss: Patient is a bit tangential but lovely, she scored a 30 out of 30 on the MMSE.  I suspect her cognitive complaints  are multifactorial including mild depression, sedentary lifestyle without exercise and poor diet, untreated obstructive sleep apnea, and normal cognitive aging. But cannot rule out mild cognitive impairment.  We will continue to follow patient and encourage her to address all these issues and if she continues to feel she is cognitively impaired after addressing the noted issues especially sleep apnea, then we can consider formal neurocognitive testing.  Obstructive sleep apnea: She reports having a sleep test 8 or 10 years ago.  We called Dr. Felipa Emory office and they do not have a record of it.  We also called Dr. Ruben Gottron office in [place] and are waiting to see if they can find it and there "old system".  Patient says she was diagnosed with sleep apnea but declined the CPAP machine.  I explained to her the sequelae of untreated sleep apnea including chronic fatigue, memory loss, headaches, stroke, cardiovascular disease.  Her Epworth sleepiness scale was only a 5 however considering that she was diagnosed in the past many years ago I do recommend that she have a repeat sleep evaluation and treatment.  We will check for common labs today such as B12 and homocystine.  Imbalance: Patient reports imbalance and falls.  Her exam showed very brisk reflexes and upgoing right toe, will order MRI of the cervical spine to evaluate for cervical myelopathy or any other issues in the cervical spine that could be causing her imbalance and clinical findings.  Lifestyle: Declines PT. I did encourage her to try to get some exercise, something that would hurt her knee, possibly stationary bike, water aerobics would be ideal.  Also recommended that she evaluate her diet, possibly a recommendation to a dietitian she can discuss with her primary care.   Orders Placed This Encounter  Procedures  . MR CERVICAL SPINE WO CONTRAST  . B12 and Folate Panel  . Homocysteine  . Methylmalonic acid, serum  . RPR  . Ambulatory  referral to Sleep Studies     Cc: Ernestene Kiel, MD  A total of 90 minutes was spent face-to-face with this patient. Over half this time was spent on counseling patient on the  1. Memory loss   2. Imbalance   3. Obstructive sleep apnea syndrome   4. Abnormality of gait   5. Fall, initial encounter   6. Ataxia   7. Cervical arthritis with myelopathy   8. 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 Screening for B12 deficiency    diagnosis and different diagnostic and therapeutic options, counseling and coordination of care, risks ans benefits of management, compliance, or risk factor reduction and education.    Sarina Ill, MD  [facility] [address] [place], [place] [address]  Phone [phone] Fax [phone]

## 2019-01-09 ENCOUNTER — Telehealth: Payer: Self-pay | Admitting: Neurology

## 2019-01-09 NOTE — Telephone Encounter (Signed)
[employer] medicare order sent to [facility]. No auth they will reach out to the pt to schedule.  °

## 2019-01-11 LAB — METHYLMALONIC ACID, SERUM: Methylmalonic Acid: 182 nmol/L (ref 0–378)

## 2019-01-11 LAB — HOMOCYSTEINE: Homocysteine: 15.6 umol/L (ref 0.0–19.2)

## 2019-01-11 LAB — B12 AND FOLATE PANEL
Folate: 10.6 ng/mL (ref >3.0)
Vitamin B-12: 312 pg/mL (ref 232–1245)

## 2019-01-11 LAB — RPR: RPR Ser Ql: NONREACTIVE

## 2019-01-14 ENCOUNTER — Telehealth: Payer: Self-pay | Admitting: *Deleted

## 2019-01-14 NOTE — Telephone Encounter (Signed)
-----   Message from Melvenia Beam, MD sent at 01/11/2019  2:45 PM EDT ----- Labs are all normal, thanks

## 2019-01-14 NOTE — Telephone Encounter (Signed)
Called pt and LVM (ok per DPR) advising Dr. Jaynee Eagles would like her to have the MRI at [facility]. Advised she prefers for our physicians here to read the imaging. I left the office number in the message and asked for a call back if she has any questions.

## 2019-01-14 NOTE — Telephone Encounter (Signed)
Kerri Garza, just fyi patient reports she was diagnosed with sleep apnea in the past but we cannot find her records, we have called her pcp as well as the facility in [place]. It may have been 10 years ago or so. I will let Dr. Brett Fairy know as well.

## 2019-01-14 NOTE — Telephone Encounter (Signed)
Called pt & let her know her labs are normal. Pt's questions were answered. She would like to have her MRI done in [place].

## 2019-01-14 NOTE — Telephone Encounter (Signed)
I spoke to [facility] & she said that  They don't have any records on the patient.

## 2019-01-14 NOTE — Telephone Encounter (Signed)
Spoke with Dr. Jaynee Eagles. She would like MRI to be read by our neuroradiologists and [place] reads their own. Prefer for pt to have MRI at [facility].

## 2019-01-16 NOTE — Telephone Encounter (Signed)
[facility] used to have a sleep lab and there is a private lab and CPAP company- Dr. Lennox Solders ( spelling ?). Could be either facility.

## 2019-01-17 NOTE — Telephone Encounter (Signed)
Thank you :)

## 2019-01-22 ENCOUNTER — Telehealth: Payer: Self-pay | Admitting: *Deleted

## 2019-01-22 NOTE — Telephone Encounter (Signed)
I gave the sleep study  copy  to [facility] in the sleep lab dept.

## 2019-01-22 NOTE — Telephone Encounter (Signed)
Kerri Garza from [facility] returned my call. She stated to disregard the documents that were sent and that she will now be sending the 2012 sleep study that they have. Pt saw Dr. Alcide Clever @ [facility] in 2012. Confirmed she will send to [phone]. Will give to medical records dept upon receipt.

## 2019-01-22 NOTE — Telephone Encounter (Signed)
We received a progress note from Dr. Felipa Emory office (dated 06/24/2018) along with a sheet from [facility] saying "patient test pending-prescription incomplete or invalid) with no order specified. I called Dr. Felipa Emory office and spoke with phone staff who will relay message to [facility] to look into this.

## 2019-01-22 NOTE — Telephone Encounter (Signed)
Spoke with [facility] @ [facility]. She will fax a sleep study they located from 2012 @ [place]. Pt had seen Dr. Alcide Clever.

## 2019-01-22 NOTE — Telephone Encounter (Signed)
PSG report received from [facility]. Completed @ [facility]. Report given to [facility].

## 2019-01-27 ENCOUNTER — Institutional Professional Consult (permissible substitution): Payer: Medicare Other | Admitting: Neurology

## 2019-02-03 ENCOUNTER — Institutional Professional Consult (permissible substitution): Payer: Medicare Other | Admitting: Neurology

## 2019-02-04 ENCOUNTER — Ambulatory Visit (INDEPENDENT_AMBULATORY_CARE_PROVIDER_SITE_OTHER): Payer: Medicare Other | Admitting: Neurology

## 2019-02-04 ENCOUNTER — Encounter: Payer: Self-pay | Admitting: Neurology

## 2019-02-04 ENCOUNTER — Other Ambulatory Visit: Payer: Self-pay

## 2019-02-04 VITALS — BP 152/70 | HR 76 | Temp 98.2°F | Ht 62.0 in | Wt 187.0 lb

## 2019-02-04 DIAGNOSIS — F329 Major depressive disorder, single episode, unspecified: Secondary | ICD-10-CM | POA: Diagnosis not present

## 2019-02-04 DIAGNOSIS — G478 Other sleep disorders: Secondary | ICD-10-CM | POA: Insufficient documentation

## 2019-02-04 DIAGNOSIS — F32A Depression, unspecified: Secondary | ICD-10-CM

## 2019-02-04 DIAGNOSIS — H3323 Serous retinal detachment, bilateral: Secondary | ICD-10-CM

## 2019-02-04 DIAGNOSIS — R490 Dysphonia: Secondary | ICD-10-CM

## 2019-02-04 DIAGNOSIS — R251 Tremor, unspecified: Secondary | ICD-10-CM | POA: Diagnosis not present

## 2019-02-04 DIAGNOSIS — R400 Somnolence: Secondary | ICD-10-CM

## 2019-02-04 DIAGNOSIS — R5383 Other fatigue: Secondary | ICD-10-CM

## 2019-02-04 NOTE — Patient Instructions (Signed)
Sleep Study    The sleep study consists of a recording of your brain waves (EEG). Breathing, heart rate and rhythm (ECG), oxygen level, eye movement, and leg movement.  The technician will glue or or paste several electrodes to your scalp, face, chest and legs.  You will have belts around your chest and abdomen to record breathing and a finger clasp to check blood oxygen levels.  A tube at your mouth and nose will detect airflow.  There are no needle sticks or painful procedures of any sort.  You will have your own room, and we will make every effort to attend to your comfort and privacy.  Please prepare for your study by the following steps:   Please avoid coffee, tea, soda, chocolate and other caffeine foods or beverages after 12:00 noon on the day of your sleep study.   You must arrive with clean (no oils), conditioners or make up, and please make sure that you wash your hair to ensure that your hair and scalp are clean, dry and free of any hair extensions on the day of your study.  This will help to get a good reading of study.  Please try not to nap on the day of your study.  Please bring a list of all your medications.  Bring any medications that you might need during the time you are within the laboratory, including insulin, sleeping pills, pain medication and anxiety medications.  Bring snacks, water or juice  Please bring clothes to sleep in and your normal overnight bag.  Please leave valuable at home, as we will not be responsible for any lost items.  If you have any further questions, please feel free to call our office. Thank you  **Please call our office 48 in advance to cancel or reschedule to avoid a $100.00 early cancel or no show fee**  

## 2019-02-04 NOTE — Progress Notes (Signed)
SLEEP MEDICINE CLINIC    Provider:  Larey Seat, MD  Primary Care Physician:  Ernestene Kiel, MD [place]. [place] [address]     Referring Provider: Dr Jaynee Eagles        Chief Complaint according to patient   Patient presents with:    . New Patient (Initial Visit)           HISTORY OF PRESENT ILLNESS:  Kerri Garza is a 78 y.o. year old White or Caucasian female patient seen here as a referral on 02/04/2019 from Dr. Jaynee Eagles for a sleep consultation.  She has seen Dr. Jaynee Eagles for perceived memory loss and tremors.    Chief concern according to patient : "I just feel fatigued and not sleepy"   I have the pleasure of seeing Kerri AMBROCIO today, a right -handed White or Caucasian female with a possible sleep disorder.  She has a  has a past medical history of Lymphoma ([place]) and Retinal detachment.. 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   The patient had the first sleep study in the year 2012 by Dr Alcide Clever  with a result of an AHI ( Apnea Hypopnea index)  Unnamed , a RDI ( Respiratory Disturbance Index) of 2/h, an oxygen saturation Nadir remained unnamed.     Sleep relevant medical history: Nocturia/ Enuresis 2 times , Sleep walking: no , Night terrors; no , other Parasomnia ;no ,  Tonsillectomy: at age 60,  cervical spine surgery: no  deviated septum repair/UPPP; no     Family medical /sleep history: she is a single child- no known other family member on CPAP with OSA, insomnia, sleep walkers.  father had PD.    Social history:  Patient is retired from [employer] ( 73)  and lives in a household alone, widowed since 2009.husband had a renal transplant.  . Family status 2 adult sons, born 47 and 79 children, 4 living  grandchildren.  The patient currently works in a sunday school. Tobacco use: never   ETOH use none ,  Caffeine intake in form of Coffee(1 in AM some mornings) Soda( none   ) Tea ( 1-2 a day ), nor energy drinks. Regular exercise in form  of walking   Hobbies outreach through church      Sleep habits are as follows: The patient's dinner time is between 8 PM. The patient goes to bed at 11 PM and has no trouble to initiate sleep, bedroom is cool, quiet but not dark - has a TV in the background. She  continues to sleep for 4 hours, wakes for 2 bathroom breaks, the first time at 3  AM.   The preferred sleep position is the right side , with the support of 2 pillows.  Dreams are reportedly  Frequent/vivid and she dreams of school, her former work.  8  AM is the usual rise time. The patient wakes up swith an alarm.  She reports not feeling refreshed or restored in AM, with symptoms such as dry mouth but no morning headaches and  With a lot of residual fatigue. She sleeps sounder at her sons house.   Naps are taken infrequently, she tries to work through sleepiness.   Review of Systems: Out of a complete 14 system review, the patient complains of only the following symptoms, and all other reviewed systems are negative.:  Fatigue, sleepiness , snoring, fragmented sleep, Insomnia    How likely are you to doze in the following  situations: 0 = not likely, 1 = slight chance, 2 = moderate chance, 3 = high chance   Sitting and Reading? Watching Television? Sitting inactive in a public place (theater or meeting)? As a passenger in a car for an hour without a break? Lying down in the afternoon when circumstances permit? Sitting and talking to someone? Sitting quietly after lunch without alcohol? In a car, while stopped for a few minutes in traffic?   Total = 3/ 24 points   FSS endorsed at 42/ 63 points.   Snoring is not known-  Tremor , tremor noted in writing .   Social History   Socioeconomic History  . Marital status: Widowed    Spouse name: Not on file  . Number of children: 2  . Years of education: 57  . Highest education level: Bachelor's degree (e.g., BA, AB, BS)  Occupational History  . Not on file  Social Needs   . Financial resource strain: Not on file  . Food insecurity    Worry: Not on file    Inability: Not on file  . Transportation needs    Medical: Not on file    Non-medical: Not on file  Tobacco Use  . Smoking status: Never Smoker  . Smokeless tobacco: Never Used  Substance and Sexual Activity  . Alcohol use: Not Currently    Alcohol/week: 0.0 standard drinks    Comment: wine in past, none in years  . Drug use: Never  . Sexual activity: Not on file  Lifestyle  . Physical activity    Days per week: Not on file    Minutes per session: Not on file  . Stress: Not on file  Relationships  . Social Herbalist on phone: Not on file    Gets together: Not on file    Attends religious service: Not on file    Active member of club or organization: Not on file    Attends meetings of clubs or organizations: Not on file    Relationship status: Not on file  Other Topics Concern  . Not on file  Social History Narrative   Lives at home alone   Right handed    Family History  Problem Relation Age of Onset  . High blood pressure Mother   . Stroke Mother   . Parkinson's disease Father   . Dementia Maternal Grandmother   . Cancer Maternal Grandfather        thinks it was pancreatic  . Heart disease Paternal Grandmother   . Suicidality Paternal Grandfather     Past Medical History:  Diagnosis Date  . Lymphoma ([place])    she was diagnosed several years ago for about 2.5 years but then labs were completely normal since then. Received no treatment.  . Retinal detachment    bilateral    Past Surgical History:  Procedure Laterality Date  . CATARACT EXTRACTION Bilateral   . TONSILLECTOMY  1947     Current Outpatient Medications on File Prior to Visit  Medication Sig Dispense Refill  . aspirin 325 MG tablet Take 325 mg by mouth daily.     . Coenzyme Q10 (COQ10) 100 MG CAPS Take 200 mg by mouth daily.    . Ergocalciferol (VITAMIN D2 PO) Take 2,000 Units by mouth daily.      Marland Kitchen FIBER PO Take 2 each by mouth. Fiber well gummies 5 gram each    . fluticasone (FLONASE) 50 MCG/ACT nasal spray Place into both nostrils.    Marland Kitchen  metroNIDAZOLE (METROCREAM) 0.75 % cream Apply 1 application topically daily.    Marland Kitchen nystatin (MYCOSTATIN/NYSTOP) powder Apply topically as needed. Under breasts    . Omega-3 Fatty Acids (FISH OIL) 1200 MG CAPS Take by mouth.    . spironolactone (ALDACTONE) 25 MG tablet Take 6.25 mg by mouth daily as needed (swelling).    . TURMERIC CURCUMIN PO Take by mouth.    Marland Kitchen UNABLE TO FIND Take 500 mg by mouth daily. Med Name: veggie caps by healthy origins    . UNKNOWN TO PATIENT Medication she takes as needed for rosacea    . WELCHOL 625 MG tablet TAKE THREE TABLETS TWICE DAILY  4   No current facility-administered medications on file prior to visit.     Allergies  Allergen Reactions  . Bactrim Ds [Sulfamethoxazole-Trimethoprim]     Irritability & lip swelling  . Lisinopril     URI symptoms  . Losartan Potassium     Fingers felt numb  . Metoprolol     Made her tired  . Moxifloxacin Other (See Comments)    Eye irritation  . Propranolol     Hair loss  . Ranitidine Hcl     Hair loss    Physical exam:  Today's Vitals   02/04/19 0948  BP: (!) 152/70  Pulse: 76  Temp: 98.2 F (36.8 C)  Weight: 187 lb (84.8 kg)  Height: 5\' 2"  (1.575 m)   Body mass index is 34.2 kg/m.   Wt Readings from Last 3 Encounters:  02/04/19 187 lb (84.8 kg)  01/08/19 187 lb (84.8 kg)     Ht Readings from Last 3 Encounters:  02/04/19 5\' 2"  (1.575 m)  01/08/19 5\' 2"  (1.575 m)      General: The patient is awake, alert and appears not in acute distress. The patient is well groomed. Head: Normocephalic, atraumatic. Neck is supple. Mallampati  4 neck circumference:15 inches .  Nasal airflow  patent.  Retrognathia is not  seen. Titubation, mild hand and face tremor.  Dental status: intact.  Cardiovascular:  Regular rate and cardiac rhythm by pulse,  without  distended neck veins. Respiratory: Lungs are clear to auscultation.  Skin:  Without evidence of ankle edema, or rash. Trunk: The patient's posture is erect.   Neurologic exam : The patient is awake and alert, oriented to place and time.   Memory subjective described as impaired.  MMSE - Mini Mental State Exam 01/08/2019  Orientation to time 5  Orientation to Place 5  Registration 3  Attention/ Calculation 5  Recall 3  Language- name 2 objects 2  Language- repeat 1  Language- follow 3 step command 3  Language- read & follow direction 1  Write a sentence 1  Copy design 1  Total score 30   Attention span & concentration ability appears normal.  Speech is fluent,  without  dysarthria, dysphonia or aphasia.  Mood and affect are appropriate.   Cranial nerves: no loss of smell or taste reported  Pupils are equal and briskly reactive to light. Extraocular movements in vertical and horizontal planes were intact and without nystagmus. No Diplopia. Visual fields by finger perimetry are intact. Hearing was intact to soft voice and finger rubbing.    Facial sensation intact to fine touch.  Facial motor strength is symmetric and tongue and uvula move midline.  Neck ROM : rotation, tilt and flexion extension were normal for age and shoulder shrug was symmetrical.    Motor exam:  Symmetric bulk, tone  and ROM.   Normal tone without cog wheeling, symmetric grip strength . Sensory:  Fine touch, pinprick and vibration were tested  and  normal.  Proprioception tested in the upper extremities was normal. Coordination: Rapid alternating movements in the fingers/hands were of normal speed.  The Finger-to-nose maneuver was intact without evidence of ataxia, dysmetria but there is bilateral  tremor.  Gait and station: Patient could rise unassisted from a seated position, walked without assistive device..  Stance is of normal width/ base and the patient turned with 4 steps.  Toe and heel walk were  deferred.  Deep tendon reflexes: in the upper and lower extremities are symmetric and intact.  Babinski response was deferred     After spending a total time of  35  minutes face to face and additional time for physical and neurologic examination, review of laboratory studies,  personal review of imaging studies, reports and results of other testing and review of referral information / records as far as provided in visit, I have established the following assessments:  1) fatigue and sleepiness seem to be influenced by worries, anxiety and especially isolation in Coronavirus times.  2) possisble snoring,  dry mouth  3) sleep hygiene to be improved- she sleeps better at her son's house - I believe that's related  to loneliness.    My Plan is to proceed with:  1) I will screen this patient for sleep apnea, hypoxemia, and  PLms -  2) I handed the patient a insomnia boot camp instruction.   I would like to thank Ernestene Kiel, MD and Ernestene Kiel, [address],  [place] [id number] for allowing me to meet with and to take care of this pleasant patient.   In short, Kerri Garza is presenting with fatigue, sleepiness , a symptom that can be attributed to apnea but also to depression..  I plan to follow up either personally or through our NP within 3  month.   CC: I will share my notes with Dr Jaynee Eagles and Dr Laqueta Due.  .  Electronically signed by: Larey Seat, MD 02/04/2019 10:24 AM  [facility] certified by the [employer] of Sleep Medicine , Diplomate of the [employer] of Sleep Medicine. Board certified In Neurology through the [place],  Fellow of the [employer] of Neurology. Medical Director of [employer].

## 2019-02-26 ENCOUNTER — Other Ambulatory Visit: Payer: Self-pay

## 2019-02-26 ENCOUNTER — Ambulatory Visit (INDEPENDENT_AMBULATORY_CARE_PROVIDER_SITE_OTHER): Payer: Medicare Other | Admitting: Neurology

## 2019-02-26 DIAGNOSIS — R5383 Other fatigue: Secondary | ICD-10-CM

## 2019-02-26 DIAGNOSIS — H3323 Serous retinal detachment, bilateral: Secondary | ICD-10-CM

## 2019-02-26 DIAGNOSIS — R251 Tremor, unspecified: Secondary | ICD-10-CM

## 2019-02-26 DIAGNOSIS — F329 Major depressive disorder, single episode, unspecified: Secondary | ICD-10-CM

## 2019-02-26 DIAGNOSIS — R001 Bradycardia, unspecified: Secondary | ICD-10-CM

## 2019-02-26 DIAGNOSIS — G4733 Obstructive sleep apnea (adult) (pediatric): Secondary | ICD-10-CM

## 2019-02-26 DIAGNOSIS — R400 Somnolence: Secondary | ICD-10-CM

## 2019-02-26 DIAGNOSIS — R490 Dysphonia: Secondary | ICD-10-CM

## 2019-03-05 DIAGNOSIS — R251 Tremor, unspecified: Secondary | ICD-10-CM | POA: Insufficient documentation

## 2019-03-05 DIAGNOSIS — R400 Somnolence: Secondary | ICD-10-CM | POA: Insufficient documentation

## 2019-03-05 DIAGNOSIS — R001 Bradycardia, unspecified: Secondary | ICD-10-CM | POA: Insufficient documentation

## 2019-03-05 DIAGNOSIS — R490 Dysphonia: Secondary | ICD-10-CM | POA: Insufficient documentation

## 2019-03-05 DIAGNOSIS — F32A Depression, unspecified: Secondary | ICD-10-CM | POA: Insufficient documentation

## 2019-03-05 DIAGNOSIS — F329 Major depressive disorder, single episode, unspecified: Secondary | ICD-10-CM | POA: Insufficient documentation

## 2019-03-05 NOTE — Procedures (Signed)
PATIENT'S NAME:  Kerri Garza, Kerri Garza:      Dec 20, 1940      MRN:    [id number]     DATE OF RECORDING: 02/26/2019 REFERRING M.D.:  Sarina Ill MD  Study Performed:   Baseline Polysomnogram HISTORY:  Kerri Garza is a 78 y.o. year old Caucasian right handed female patient seen here upon referral on 02/04/2019 from Dr. Jaynee Eagles for a sleep consultation.  She has seen Dr. Jaynee Eagles for subjectively perceived memory loss and tremors. She reports being fatigued. She goes each night 1-2 times to the bathroom, dreams often vividly about her former workplace, has trouble to initiate sleep and stay asleep. Sleeps with TV in the background.   The patient endorsed the Epworth Sleepiness Scale at 3 points and the FSS at 42 Points. The patient's weight 187 pounds with a height of 62 (inches), resulting in a BMI of 34.5 kg/m2. The patient's neck circumference measured 15 inches.  CURRENT MEDICATIONS: Aspirin, CoQ10, Vitamin D2, Fiber, Mycostatin, fish oil, Aldactone, Turmeric, Welchol, Unknown   PROCEDURE:  This is a multichannel digital polysomnogram utilizing the Somnostar 11.2 system.  Electrodes and sensors were applied and monitored per AASM Specifications.   EEG, EOG, Chin and Limb EMG, were sampled at 200 Hz.  ECG, Snore and Nasal Pressure, Thermal Airflow, Respiratory Effort, CPAP Flow and Pressure, Oximetry was sampled at 50 Hz. Digital video and audio were recorded.      BASELINE STUDY: Lights Out was at 21:43 and Lights On at 04:54.  Total recording time (TRT) was 431.5 minutes, with a total sleep time (TST) of 340.5 minutes.   The patient's sleep latency was 19 minutes.  REM latency was 117.5 minutes.  The sleep efficiency was 78.9 %.     SLEEP ARCHITECTURE: WASO (Wake after sleep onset) was 70 minutes.  There were 22 minutes in Stage N1, 224 minutes Stage N2, 44 minutes Stage N3 and 50.5 minutes in Stage REM.   The percentage of Stage N1 was 6.5%, Stage N2 was 65.8%, Stage N3 was 12.9% and Stage R (REM sleep) was  14.8%.  RESPIRATORY ANALYSIS:  There were a total of 22 respiratory events:  10 obstructive apneas, 0 central apneas and 3 mixed apneas with 9 hypopneas.    The total APNEA/HYPOPNEA INDEX (AHI) was 3.9 /hour.  17 events occurred in REM sleep and 8 events in NREM. The REM AHI was 20.2 /hour, versus a non-REM AHI of 1.0/h.  The patient spent 155 minutes of total sleep time in the supine position and 186 minutes in non-supine.  The supine AHI was 3.4  /h versus a non-supine AHI of 4.2/h.  OXYGEN SATURATION & C02:  The Wake baseline 02 saturation was 96%, with the lowest being 83%. Time spent below 89% saturation equaled 33 minutes. Hypoxia was clustered during REM sleep. Some non -periodic limb movements were also seen during REM sleep.  There was loud snoring noted in NREM 3 stage while the patient was sleeping on the right side.    AROUSALS:  The arousals were noted as: 38 were spontaneous, 0 were associated with PLMs, and 8 were associated with respiratory events. The patient had a total of 0 Periodic Limb Movements  Audio and video analysis did not show any abnormal or unusual movements, complex behaviors, phonations or vocalizations.  Hypoxia was clustered during REM sleep. Some isolated non -periodic limb movements were also seen during REM sleep.  There was loud snoring noted only while in NREM 3 stage and  while the patient was sleeping on the right side.  EKG in normal sinus rhythm (NSR). Nocturia noted once, shortly after midnight.    IMPRESSION:  1. Clinically insignificant overall degree Obstructive Sleep Apnea(OSA) at AHI of 3.9, but in REM sleep exacerbated to 20.2/h. 2. Hypoxemia and sleep apnea where clustered in REM sleep. Total time in hypoxia was 33 minutes and would not allow for intervention.  3. Some sudden movements were noted during REM sleep, but these were isolated, not periodic and would not allow for a diagnosis of REM BD.      RECOMMENDATIONS:  1. There is not  enough apnea here to justify CPAP intervention. I recommend to stay of supine sleep position and consider a snoring aid, such as nasal strips or a dental appliance.      I certify that I have reviewed the entire raw data recording prior to the issuance of this report in accordance with the Standards of Accreditation of the American Academy of Sleep Medicine (AASM)   Larey Seat, MD    03-05-2019  Diplomat, American Board of Psychiatry and Neurology  Diplomat, American Board of [place] Director, [employer] [facility] [employer]

## 2019-03-06 ENCOUNTER — Telehealth: Payer: Self-pay | Admitting: Neurology

## 2019-03-06 NOTE — Telephone Encounter (Signed)
Called the patient and reviewed the sleep study findings. Advised the patient that I would send a copy to her PCP and Dr Jaynee Eagles and send a copy to her in the mail. Pt verbalized understanding.

## 2019-03-06 NOTE — Telephone Encounter (Signed)
-----   Message from Larey Seat, MD sent at 03/05/2019  5:11 PM EDT ----- IMPRESSION:   1. Clinically insignificant overall degree Obstructive Sleep  Apnea(OSA) at AHI of 3.9, but in REM sleep exacerbated to 20.2/h.  2. Hypoxemia and sleep apnea where clustered in REM sleep. Total  time in hypoxia was 33 minutes and would not allow for  intervention.  3. Some sudden movements were noted during REM sleep, but these  were isolated, not periodic and would not allow for a diagnosis  of REM BD.     RECOMMENDATIONS: A follow up with me is optional, as I do not order any therapy or intervention   1. There is not enough apnea here to justify CPAP intervention. I  recommend to stay of supine sleep position and consider a snoring  aid, such as nasal strips or a dental appliance.

## 2019-09-04 DIAGNOSIS — H33001 Unspecified retinal detachment with retinal break, right eye: Secondary | ICD-10-CM | POA: Diagnosis not present

## 2019-09-04 DIAGNOSIS — Z961 Presence of intraocular lens: Secondary | ICD-10-CM | POA: Diagnosis not present

## 2019-09-04 DIAGNOSIS — H33012 Retinal detachment with single break, left eye: Secondary | ICD-10-CM | POA: Diagnosis not present

## 2019-09-04 DIAGNOSIS — D3132 Benign neoplasm of left choroid: Secondary | ICD-10-CM | POA: Diagnosis not present

## 2019-09-04 DIAGNOSIS — H40053 Ocular hypertension, bilateral: Secondary | ICD-10-CM | POA: Diagnosis not present

## 2019-09-04 DIAGNOSIS — H35372 Puckering of macula, left eye: Secondary | ICD-10-CM | POA: Diagnosis not present

## 2019-09-08 DIAGNOSIS — R400 Somnolence: Secondary | ICD-10-CM | POA: Diagnosis not present

## 2019-09-08 DIAGNOSIS — R0902 Hypoxemia: Secondary | ICD-10-CM | POA: Diagnosis not present

## 2019-09-17 DIAGNOSIS — Z6834 Body mass index (BMI) 34.0-34.9, adult: Secondary | ICD-10-CM | POA: Diagnosis not present

## 2019-09-17 DIAGNOSIS — E785 Hyperlipidemia, unspecified: Secondary | ICD-10-CM | POA: Diagnosis not present

## 2019-09-17 DIAGNOSIS — R7301 Impaired fasting glucose: Secondary | ICD-10-CM | POA: Diagnosis not present

## 2019-09-17 DIAGNOSIS — I10 Essential (primary) hypertension: Secondary | ICD-10-CM | POA: Diagnosis not present

## 2019-09-17 DIAGNOSIS — Z1331 Encounter for screening for depression: Secondary | ICD-10-CM | POA: Diagnosis not present

## 2019-10-09 DIAGNOSIS — R0902 Hypoxemia: Secondary | ICD-10-CM | POA: Diagnosis not present

## 2019-10-09 DIAGNOSIS — R400 Somnolence: Secondary | ICD-10-CM | POA: Diagnosis not present

## 2019-10-27 DIAGNOSIS — L309 Dermatitis, unspecified: Secondary | ICD-10-CM | POA: Diagnosis not present

## 2019-11-05 DIAGNOSIS — L578 Other skin changes due to chronic exposure to nonionizing radiation: Secondary | ICD-10-CM | POA: Diagnosis not present

## 2019-11-05 DIAGNOSIS — D225 Melanocytic nevi of trunk: Secondary | ICD-10-CM | POA: Diagnosis not present

## 2019-11-05 DIAGNOSIS — L814 Other melanin hyperpigmentation: Secondary | ICD-10-CM | POA: Diagnosis not present

## 2019-11-05 DIAGNOSIS — D2239 Melanocytic nevi of other parts of face: Secondary | ICD-10-CM | POA: Diagnosis not present

## 2019-11-06 DIAGNOSIS — R0902 Hypoxemia: Secondary | ICD-10-CM | POA: Diagnosis not present

## 2019-11-06 DIAGNOSIS — R400 Somnolence: Secondary | ICD-10-CM | POA: Diagnosis not present

## 2019-12-07 DIAGNOSIS — R400 Somnolence: Secondary | ICD-10-CM | POA: Diagnosis not present

## 2019-12-07 DIAGNOSIS — R0902 Hypoxemia: Secondary | ICD-10-CM | POA: Diagnosis not present

## 2019-12-18 DIAGNOSIS — R3915 Urgency of urination: Secondary | ICD-10-CM | POA: Diagnosis not present

## 2019-12-18 DIAGNOSIS — R519 Headache, unspecified: Secondary | ICD-10-CM | POA: Diagnosis not present

## 2019-12-18 DIAGNOSIS — Z6834 Body mass index (BMI) 34.0-34.9, adult: Secondary | ICD-10-CM | POA: Diagnosis not present

## 2019-12-18 DIAGNOSIS — R197 Diarrhea, unspecified: Secondary | ICD-10-CM | POA: Diagnosis not present

## 2019-12-18 DIAGNOSIS — Z1152 Encounter for screening for COVID-19: Secondary | ICD-10-CM | POA: Diagnosis not present

## 2019-12-18 DIAGNOSIS — M25561 Pain in right knee: Secondary | ICD-10-CM | POA: Diagnosis not present

## 2020-01-06 DIAGNOSIS — R0902 Hypoxemia: Secondary | ICD-10-CM | POA: Diagnosis not present

## 2020-01-06 DIAGNOSIS — R400 Somnolence: Secondary | ICD-10-CM | POA: Diagnosis not present

## 2020-01-14 DIAGNOSIS — R1011 Right upper quadrant pain: Secondary | ICD-10-CM | POA: Diagnosis not present

## 2020-01-14 DIAGNOSIS — R7301 Impaired fasting glucose: Secondary | ICD-10-CM | POA: Diagnosis not present

## 2020-01-14 DIAGNOSIS — R0602 Shortness of breath: Secondary | ICD-10-CM | POA: Diagnosis not present

## 2020-01-14 DIAGNOSIS — R5383 Other fatigue: Secondary | ICD-10-CM | POA: Diagnosis not present

## 2020-01-14 DIAGNOSIS — E785 Hyperlipidemia, unspecified: Secondary | ICD-10-CM | POA: Diagnosis not present

## 2020-01-14 DIAGNOSIS — R351 Nocturia: Secondary | ICD-10-CM | POA: Diagnosis not present

## 2020-02-03 DIAGNOSIS — R0602 Shortness of breath: Secondary | ICD-10-CM | POA: Diagnosis not present

## 2020-02-03 DIAGNOSIS — J984 Other disorders of lung: Secondary | ICD-10-CM | POA: Diagnosis not present

## 2020-02-03 DIAGNOSIS — R079 Chest pain, unspecified: Secondary | ICD-10-CM | POA: Diagnosis not present

## 2020-02-04 DIAGNOSIS — H40053 Ocular hypertension, bilateral: Secondary | ICD-10-CM | POA: Diagnosis not present

## 2020-02-06 DIAGNOSIS — R0902 Hypoxemia: Secondary | ICD-10-CM | POA: Diagnosis not present

## 2020-02-06 DIAGNOSIS — R400 Somnolence: Secondary | ICD-10-CM | POA: Diagnosis not present

## 2020-03-07 DIAGNOSIS — R0902 Hypoxemia: Secondary | ICD-10-CM | POA: Diagnosis not present

## 2020-03-07 DIAGNOSIS — R400 Somnolence: Secondary | ICD-10-CM | POA: Diagnosis not present

## 2020-04-06 DIAGNOSIS — R5383 Other fatigue: Secondary | ICD-10-CM | POA: Diagnosis not present

## 2020-04-06 DIAGNOSIS — M1711 Unilateral primary osteoarthritis, right knee: Secondary | ICD-10-CM | POA: Diagnosis not present

## 2020-04-06 DIAGNOSIS — D692 Other nonthrombocytopenic purpura: Secondary | ICD-10-CM | POA: Diagnosis not present

## 2020-04-06 DIAGNOSIS — Z6834 Body mass index (BMI) 34.0-34.9, adult: Secondary | ICD-10-CM | POA: Diagnosis not present

## 2020-04-07 DIAGNOSIS — R400 Somnolence: Secondary | ICD-10-CM | POA: Diagnosis not present

## 2020-04-07 DIAGNOSIS — R0902 Hypoxemia: Secondary | ICD-10-CM | POA: Diagnosis not present

## 2020-05-08 DIAGNOSIS — R0902 Hypoxemia: Secondary | ICD-10-CM | POA: Diagnosis not present

## 2020-05-08 DIAGNOSIS — R400 Somnolence: Secondary | ICD-10-CM | POA: Diagnosis not present

## 2020-05-13 DIAGNOSIS — D485 Neoplasm of uncertain behavior of skin: Secondary | ICD-10-CM | POA: Diagnosis not present

## 2020-05-13 DIAGNOSIS — D1801 Hemangioma of skin and subcutaneous tissue: Secondary | ICD-10-CM | POA: Diagnosis not present

## 2020-05-13 DIAGNOSIS — L719 Rosacea, unspecified: Secondary | ICD-10-CM | POA: Diagnosis not present

## 2020-05-13 DIAGNOSIS — D225 Melanocytic nevi of trunk: Secondary | ICD-10-CM | POA: Diagnosis not present

## 2020-05-13 DIAGNOSIS — D2239 Melanocytic nevi of other parts of face: Secondary | ICD-10-CM | POA: Diagnosis not present

## 2020-05-31 DIAGNOSIS — R7301 Impaired fasting glucose: Secondary | ICD-10-CM | POA: Diagnosis not present

## 2020-05-31 DIAGNOSIS — Z79899 Other long term (current) drug therapy: Secondary | ICD-10-CM | POA: Diagnosis not present

## 2020-05-31 DIAGNOSIS — I10 Essential (primary) hypertension: Secondary | ICD-10-CM | POA: Diagnosis not present

## 2020-05-31 DIAGNOSIS — Z1231 Encounter for screening mammogram for malignant neoplasm of breast: Secondary | ICD-10-CM | POA: Diagnosis not present

## 2020-05-31 DIAGNOSIS — E785 Hyperlipidemia, unspecified: Secondary | ICD-10-CM | POA: Diagnosis not present

## 2020-05-31 DIAGNOSIS — Z1339 Encounter for screening examination for other mental health and behavioral disorders: Secondary | ICD-10-CM | POA: Diagnosis not present

## 2020-05-31 DIAGNOSIS — Z7189 Other specified counseling: Secondary | ICD-10-CM | POA: Diagnosis not present

## 2020-05-31 DIAGNOSIS — Z0001 Encounter for general adult medical examination with abnormal findings: Secondary | ICD-10-CM | POA: Diagnosis not present

## 2020-05-31 DIAGNOSIS — B372 Candidiasis of skin and nail: Secondary | ICD-10-CM | POA: Diagnosis not present

## 2020-05-31 DIAGNOSIS — Z1331 Encounter for screening for depression: Secondary | ICD-10-CM | POA: Diagnosis not present

## 2020-05-31 DIAGNOSIS — J329 Chronic sinusitis, unspecified: Secondary | ICD-10-CM | POA: Diagnosis not present

## 2020-06-03 DIAGNOSIS — D0461 Carcinoma in situ of skin of right upper limb, including shoulder: Secondary | ICD-10-CM | POA: Diagnosis not present

## 2020-06-07 DIAGNOSIS — R0902 Hypoxemia: Secondary | ICD-10-CM | POA: Diagnosis not present

## 2020-06-07 DIAGNOSIS — R591 Generalized enlarged lymph nodes: Secondary | ICD-10-CM | POA: Diagnosis not present

## 2020-06-07 DIAGNOSIS — R400 Somnolence: Secondary | ICD-10-CM | POA: Diagnosis not present

## 2020-06-21 DIAGNOSIS — L600 Ingrowing nail: Secondary | ICD-10-CM | POA: Diagnosis not present

## 2020-07-08 DIAGNOSIS — R400 Somnolence: Secondary | ICD-10-CM | POA: Diagnosis not present

## 2020-07-08 DIAGNOSIS — R0902 Hypoxemia: Secondary | ICD-10-CM | POA: Diagnosis not present

## 2020-07-26 DIAGNOSIS — R3121 Asymptomatic microscopic hematuria: Secondary | ICD-10-CM | POA: Diagnosis not present

## 2020-08-05 DIAGNOSIS — Z1231 Encounter for screening mammogram for malignant neoplasm of breast: Secondary | ICD-10-CM | POA: Diagnosis not present

## 2020-08-07 DIAGNOSIS — R0902 Hypoxemia: Secondary | ICD-10-CM | POA: Diagnosis not present

## 2020-08-07 DIAGNOSIS — R400 Somnolence: Secondary | ICD-10-CM | POA: Diagnosis not present

## 2020-09-03 DIAGNOSIS — L0291 Cutaneous abscess, unspecified: Secondary | ICD-10-CM | POA: Diagnosis not present

## 2020-09-07 DIAGNOSIS — R400 Somnolence: Secondary | ICD-10-CM | POA: Diagnosis not present

## 2020-09-07 DIAGNOSIS — R0902 Hypoxemia: Secondary | ICD-10-CM | POA: Diagnosis not present

## 2020-10-06 DIAGNOSIS — Z20822 Contact with and (suspected) exposure to covid-19: Secondary | ICD-10-CM | POA: Diagnosis not present

## 2020-10-06 DIAGNOSIS — Z03818 Encounter for observation for suspected exposure to other biological agents ruled out: Secondary | ICD-10-CM | POA: Diagnosis not present

## 2020-10-07 DIAGNOSIS — J019 Acute sinusitis, unspecified: Secondary | ICD-10-CM | POA: Diagnosis not present

## 2020-10-07 DIAGNOSIS — Z1331 Encounter for screening for depression: Secondary | ICD-10-CM | POA: Diagnosis not present

## 2020-10-08 DIAGNOSIS — R0902 Hypoxemia: Secondary | ICD-10-CM | POA: Diagnosis not present

## 2020-10-08 DIAGNOSIS — R400 Somnolence: Secondary | ICD-10-CM | POA: Diagnosis not present

## 2020-10-28 DIAGNOSIS — H33001 Unspecified retinal detachment with retinal break, right eye: Secondary | ICD-10-CM | POA: Diagnosis not present

## 2020-10-28 DIAGNOSIS — H35372 Puckering of macula, left eye: Secondary | ICD-10-CM | POA: Diagnosis not present

## 2020-10-28 DIAGNOSIS — H33012 Retinal detachment with single break, left eye: Secondary | ICD-10-CM | POA: Diagnosis not present

## 2020-10-28 DIAGNOSIS — H40053 Ocular hypertension, bilateral: Secondary | ICD-10-CM | POA: Diagnosis not present

## 2020-10-28 DIAGNOSIS — D3132 Benign neoplasm of left choroid: Secondary | ICD-10-CM | POA: Diagnosis not present

## 2020-11-05 DIAGNOSIS — R0902 Hypoxemia: Secondary | ICD-10-CM | POA: Diagnosis not present

## 2020-11-05 DIAGNOSIS — R400 Somnolence: Secondary | ICD-10-CM | POA: Diagnosis not present

## 2020-11-23 DIAGNOSIS — L814 Other melanin hyperpigmentation: Secondary | ICD-10-CM | POA: Diagnosis not present

## 2020-11-23 DIAGNOSIS — D225 Melanocytic nevi of trunk: Secondary | ICD-10-CM | POA: Diagnosis not present

## 2020-11-23 DIAGNOSIS — D2239 Melanocytic nevi of other parts of face: Secondary | ICD-10-CM | POA: Diagnosis not present

## 2020-12-06 DIAGNOSIS — R400 Somnolence: Secondary | ICD-10-CM | POA: Diagnosis not present

## 2020-12-06 DIAGNOSIS — R0902 Hypoxemia: Secondary | ICD-10-CM | POA: Diagnosis not present

## 2020-12-17 DIAGNOSIS — Z6833 Body mass index (BMI) 33.0-33.9, adult: Secondary | ICD-10-CM | POA: Diagnosis not present

## 2020-12-17 DIAGNOSIS — G250 Essential tremor: Secondary | ICD-10-CM | POA: Diagnosis not present

## 2020-12-17 DIAGNOSIS — M1711 Unilateral primary osteoarthritis, right knee: Secondary | ICD-10-CM | POA: Diagnosis not present

## 2020-12-17 DIAGNOSIS — F418 Other specified anxiety disorders: Secondary | ICD-10-CM | POA: Diagnosis not present

## 2021-01-05 DIAGNOSIS — R400 Somnolence: Secondary | ICD-10-CM | POA: Diagnosis not present

## 2021-01-05 DIAGNOSIS — R0902 Hypoxemia: Secondary | ICD-10-CM | POA: Diagnosis not present

## 2021-01-15 DIAGNOSIS — Z20828 Contact with and (suspected) exposure to other viral communicable diseases: Secondary | ICD-10-CM | POA: Diagnosis not present

## 2021-01-18 DIAGNOSIS — B9689 Other specified bacterial agents as the cause of diseases classified elsewhere: Secondary | ICD-10-CM | POA: Diagnosis not present

## 2021-01-18 DIAGNOSIS — J069 Acute upper respiratory infection, unspecified: Secondary | ICD-10-CM | POA: Diagnosis not present

## 2021-01-18 DIAGNOSIS — J019 Acute sinusitis, unspecified: Secondary | ICD-10-CM | POA: Diagnosis not present

## 2021-02-05 DIAGNOSIS — R0902 Hypoxemia: Secondary | ICD-10-CM | POA: Diagnosis not present

## 2021-02-05 DIAGNOSIS — R400 Somnolence: Secondary | ICD-10-CM | POA: Diagnosis not present

## 2021-02-16 DIAGNOSIS — E785 Hyperlipidemia, unspecified: Secondary | ICD-10-CM | POA: Diagnosis not present

## 2021-02-16 DIAGNOSIS — I10 Essential (primary) hypertension: Secondary | ICD-10-CM | POA: Diagnosis not present

## 2021-02-16 DIAGNOSIS — R7301 Impaired fasting glucose: Secondary | ICD-10-CM | POA: Diagnosis not present

## 2021-02-23 DIAGNOSIS — J310 Chronic rhinitis: Secondary | ICD-10-CM | POA: Diagnosis not present

## 2021-02-23 DIAGNOSIS — R209 Unspecified disturbances of skin sensation: Secondary | ICD-10-CM | POA: Diagnosis not present

## 2021-02-23 DIAGNOSIS — R350 Frequency of micturition: Secondary | ICD-10-CM | POA: Diagnosis not present

## 2021-03-02 DIAGNOSIS — N3000 Acute cystitis without hematuria: Secondary | ICD-10-CM | POA: Diagnosis not present

## 2021-03-07 DIAGNOSIS — R400 Somnolence: Secondary | ICD-10-CM | POA: Diagnosis not present

## 2021-03-07 DIAGNOSIS — R0902 Hypoxemia: Secondary | ICD-10-CM | POA: Diagnosis not present

## 2021-03-25 DIAGNOSIS — L02612 Cutaneous abscess of left foot: Secondary | ICD-10-CM | POA: Diagnosis not present

## 2021-04-07 DIAGNOSIS — R400 Somnolence: Secondary | ICD-10-CM | POA: Diagnosis not present

## 2021-04-07 DIAGNOSIS — R0902 Hypoxemia: Secondary | ICD-10-CM | POA: Diagnosis not present

## 2021-04-27 DIAGNOSIS — Z20822 Contact with and (suspected) exposure to covid-19: Secondary | ICD-10-CM | POA: Diagnosis not present

## 2021-04-27 DIAGNOSIS — B349 Viral infection, unspecified: Secondary | ICD-10-CM | POA: Diagnosis not present

## 2021-04-27 DIAGNOSIS — R070 Pain in throat: Secondary | ICD-10-CM | POA: Diagnosis not present

## 2021-04-29 DIAGNOSIS — R0981 Nasal congestion: Secondary | ICD-10-CM | POA: Diagnosis not present

## 2021-04-29 DIAGNOSIS — R058 Other specified cough: Secondary | ICD-10-CM | POA: Diagnosis not present

## 2021-04-29 DIAGNOSIS — J0110 Acute frontal sinusitis, unspecified: Secondary | ICD-10-CM | POA: Diagnosis not present

## 2021-05-08 DIAGNOSIS — R400 Somnolence: Secondary | ICD-10-CM | POA: Diagnosis not present

## 2021-05-08 DIAGNOSIS — R0902 Hypoxemia: Secondary | ICD-10-CM | POA: Diagnosis not present

## 2021-05-21 DIAGNOSIS — R509 Fever, unspecified: Secondary | ICD-10-CM | POA: Diagnosis not present

## 2021-05-21 DIAGNOSIS — Z20828 Contact with and (suspected) exposure to other viral communicable diseases: Secondary | ICD-10-CM | POA: Diagnosis not present

## 2021-05-30 DIAGNOSIS — I10 Essential (primary) hypertension: Secondary | ICD-10-CM | POA: Diagnosis not present

## 2021-05-30 DIAGNOSIS — E785 Hyperlipidemia, unspecified: Secondary | ICD-10-CM | POA: Diagnosis not present

## 2021-05-30 DIAGNOSIS — R7301 Impaired fasting glucose: Secondary | ICD-10-CM | POA: Diagnosis not present

## 2021-06-03 DIAGNOSIS — I10 Essential (primary) hypertension: Secondary | ICD-10-CM | POA: Diagnosis not present

## 2021-06-03 DIAGNOSIS — E785 Hyperlipidemia, unspecified: Secondary | ICD-10-CM | POA: Diagnosis not present

## 2021-06-03 DIAGNOSIS — Z0000 Encounter for general adult medical examination without abnormal findings: Secondary | ICD-10-CM | POA: Diagnosis not present

## 2021-06-03 DIAGNOSIS — R209 Unspecified disturbances of skin sensation: Secondary | ICD-10-CM | POA: Diagnosis not present

## 2021-06-03 DIAGNOSIS — Z1331 Encounter for screening for depression: Secondary | ICD-10-CM | POA: Diagnosis not present

## 2021-06-03 DIAGNOSIS — R7301 Impaired fasting glucose: Secondary | ICD-10-CM | POA: Diagnosis not present

## 2021-06-07 DIAGNOSIS — R0902 Hypoxemia: Secondary | ICD-10-CM | POA: Diagnosis not present

## 2021-06-07 DIAGNOSIS — R400 Somnolence: Secondary | ICD-10-CM | POA: Diagnosis not present

## 2021-06-13 DIAGNOSIS — D2239 Melanocytic nevi of other parts of face: Secondary | ICD-10-CM | POA: Diagnosis not present

## 2021-06-13 DIAGNOSIS — L814 Other melanin hyperpigmentation: Secondary | ICD-10-CM | POA: Diagnosis not present

## 2021-06-13 DIAGNOSIS — D225 Melanocytic nevi of trunk: Secondary | ICD-10-CM | POA: Diagnosis not present

## 2021-06-13 DIAGNOSIS — D1801 Hemangioma of skin and subcutaneous tissue: Secondary | ICD-10-CM | POA: Diagnosis not present

## 2021-07-01 DIAGNOSIS — H40053 Ocular hypertension, bilateral: Secondary | ICD-10-CM | POA: Diagnosis not present

## 2021-07-05 DIAGNOSIS — J0100 Acute maxillary sinusitis, unspecified: Secondary | ICD-10-CM | POA: Diagnosis not present

## 2021-07-05 DIAGNOSIS — J209 Acute bronchitis, unspecified: Secondary | ICD-10-CM | POA: Diagnosis not present

## 2021-07-05 DIAGNOSIS — R509 Fever, unspecified: Secondary | ICD-10-CM | POA: Diagnosis not present

## 2021-08-06 DIAGNOSIS — Z20822 Contact with and (suspected) exposure to covid-19: Secondary | ICD-10-CM | POA: Diagnosis not present

## 2021-08-08 DIAGNOSIS — Z1231 Encounter for screening mammogram for malignant neoplasm of breast: Secondary | ICD-10-CM | POA: Diagnosis not present

## 2021-09-28 DIAGNOSIS — R7301 Impaired fasting glucose: Secondary | ICD-10-CM | POA: Diagnosis not present

## 2021-09-28 DIAGNOSIS — I10 Essential (primary) hypertension: Secondary | ICD-10-CM | POA: Diagnosis not present

## 2021-09-28 DIAGNOSIS — E785 Hyperlipidemia, unspecified: Secondary | ICD-10-CM | POA: Diagnosis not present

## 2021-10-04 DIAGNOSIS — R7301 Impaired fasting glucose: Secondary | ICD-10-CM | POA: Diagnosis not present

## 2021-10-04 DIAGNOSIS — E785 Hyperlipidemia, unspecified: Secondary | ICD-10-CM | POA: Diagnosis not present

## 2021-10-04 DIAGNOSIS — I10 Essential (primary) hypertension: Secondary | ICD-10-CM | POA: Diagnosis not present

## 2021-10-06 DIAGNOSIS — Z6832 Body mass index (BMI) 32.0-32.9, adult: Secondary | ICD-10-CM | POA: Diagnosis not present

## 2021-10-06 DIAGNOSIS — R7301 Impaired fasting glucose: Secondary | ICD-10-CM | POA: Diagnosis not present

## 2021-10-06 DIAGNOSIS — R209 Unspecified disturbances of skin sensation: Secondary | ICD-10-CM | POA: Diagnosis not present

## 2021-10-06 DIAGNOSIS — M4726 Other spondylosis with radiculopathy, lumbar region: Secondary | ICD-10-CM | POA: Diagnosis not present

## 2021-10-06 DIAGNOSIS — M79641 Pain in right hand: Secondary | ICD-10-CM | POA: Diagnosis not present

## 2021-10-06 DIAGNOSIS — E785 Hyperlipidemia, unspecified: Secondary | ICD-10-CM | POA: Diagnosis not present

## 2021-10-06 DIAGNOSIS — M79642 Pain in left hand: Secondary | ICD-10-CM | POA: Diagnosis not present

## 2021-11-03 DIAGNOSIS — G5602 Carpal tunnel syndrome, left upper limb: Secondary | ICD-10-CM | POA: Diagnosis not present

## 2021-11-03 DIAGNOSIS — G5603 Carpal tunnel syndrome, bilateral upper limbs: Secondary | ICD-10-CM | POA: Diagnosis not present

## 2021-11-03 DIAGNOSIS — G5601 Carpal tunnel syndrome, right upper limb: Secondary | ICD-10-CM | POA: Diagnosis not present

## 2021-11-03 DIAGNOSIS — H33012 Retinal detachment with single break, left eye: Secondary | ICD-10-CM | POA: Diagnosis not present

## 2021-11-03 DIAGNOSIS — H40053 Ocular hypertension, bilateral: Secondary | ICD-10-CM | POA: Diagnosis not present

## 2021-11-03 DIAGNOSIS — H33001 Unspecified retinal detachment with retinal break, right eye: Secondary | ICD-10-CM | POA: Diagnosis not present

## 2021-11-03 DIAGNOSIS — H35372 Puckering of macula, left eye: Secondary | ICD-10-CM | POA: Diagnosis not present

## 2021-11-03 DIAGNOSIS — D3132 Benign neoplasm of left choroid: Secondary | ICD-10-CM | POA: Diagnosis not present

## 2021-12-07 DIAGNOSIS — G5603 Carpal tunnel syndrome, bilateral upper limbs: Secondary | ICD-10-CM | POA: Diagnosis not present

## 2021-12-13 DIAGNOSIS — Z6833 Body mass index (BMI) 33.0-33.9, adult: Secondary | ICD-10-CM | POA: Diagnosis not present

## 2021-12-13 DIAGNOSIS — M533 Sacrococcygeal disorders, not elsewhere classified: Secondary | ICD-10-CM | POA: Diagnosis not present

## 2022-01-02 DIAGNOSIS — M4316 Spondylolisthesis, lumbar region: Secondary | ICD-10-CM | POA: Diagnosis not present

## 2022-01-02 DIAGNOSIS — M47817 Spondylosis without myelopathy or radiculopathy, lumbosacral region: Secondary | ICD-10-CM | POA: Diagnosis not present

## 2022-01-02 DIAGNOSIS — I700 Atherosclerosis of aorta: Secondary | ICD-10-CM | POA: Diagnosis not present

## 2022-01-02 DIAGNOSIS — M545 Low back pain, unspecified: Secondary | ICD-10-CM | POA: Diagnosis not present

## 2022-01-02 DIAGNOSIS — M160 Bilateral primary osteoarthritis of hip: Secondary | ICD-10-CM | POA: Diagnosis not present

## 2022-02-13 DIAGNOSIS — E785 Hyperlipidemia, unspecified: Secondary | ICD-10-CM | POA: Diagnosis not present

## 2022-02-13 DIAGNOSIS — R7301 Impaired fasting glucose: Secondary | ICD-10-CM | POA: Diagnosis not present

## 2022-02-15 DIAGNOSIS — I700 Atherosclerosis of aorta: Secondary | ICD-10-CM | POA: Diagnosis not present

## 2022-02-15 DIAGNOSIS — Z6833 Body mass index (BMI) 33.0-33.9, adult: Secondary | ICD-10-CM | POA: Diagnosis not present

## 2022-02-15 DIAGNOSIS — M5136 Other intervertebral disc degeneration, lumbar region: Secondary | ICD-10-CM | POA: Diagnosis not present

## 2022-02-15 DIAGNOSIS — R5383 Other fatigue: Secondary | ICD-10-CM | POA: Diagnosis not present

## 2022-02-15 DIAGNOSIS — E785 Hyperlipidemia, unspecified: Secondary | ICD-10-CM | POA: Diagnosis not present

## 2022-02-27 DIAGNOSIS — G5603 Carpal tunnel syndrome, bilateral upper limbs: Secondary | ICD-10-CM | POA: Diagnosis not present

## 2022-03-15 DIAGNOSIS — R42 Dizziness and giddiness: Secondary | ICD-10-CM | POA: Diagnosis not present

## 2022-03-15 DIAGNOSIS — R5383 Other fatigue: Secondary | ICD-10-CM | POA: Diagnosis not present

## 2022-03-15 DIAGNOSIS — Z6832 Body mass index (BMI) 32.0-32.9, adult: Secondary | ICD-10-CM | POA: Diagnosis not present

## 2022-05-08 DIAGNOSIS — R42 Dizziness and giddiness: Secondary | ICD-10-CM | POA: Diagnosis not present

## 2022-05-08 DIAGNOSIS — I358 Other nonrheumatic aortic valve disorders: Secondary | ICD-10-CM | POA: Diagnosis not present

## 2022-05-08 DIAGNOSIS — I6523 Occlusion and stenosis of bilateral carotid arteries: Secondary | ICD-10-CM | POA: Diagnosis not present

## 2022-05-09 DIAGNOSIS — M5451 Vertebrogenic low back pain: Secondary | ICD-10-CM | POA: Diagnosis not present

## 2022-05-22 DIAGNOSIS — Z6833 Body mass index (BMI) 33.0-33.9, adult: Secondary | ICD-10-CM | POA: Diagnosis not present

## 2022-05-22 DIAGNOSIS — I10 Essential (primary) hypertension: Secondary | ICD-10-CM | POA: Diagnosis not present

## 2022-05-24 DIAGNOSIS — D225 Melanocytic nevi of trunk: Secondary | ICD-10-CM | POA: Diagnosis not present

## 2022-05-24 DIAGNOSIS — D2239 Melanocytic nevi of other parts of face: Secondary | ICD-10-CM | POA: Diagnosis not present

## 2022-05-24 DIAGNOSIS — L814 Other melanin hyperpigmentation: Secondary | ICD-10-CM | POA: Diagnosis not present

## 2022-05-24 DIAGNOSIS — L570 Actinic keratosis: Secondary | ICD-10-CM | POA: Diagnosis not present

## 2022-05-24 DIAGNOSIS — L821 Other seborrheic keratosis: Secondary | ICD-10-CM | POA: Diagnosis not present

## 2022-06-29 DIAGNOSIS — R635 Abnormal weight gain: Secondary | ICD-10-CM | POA: Diagnosis not present

## 2022-06-29 DIAGNOSIS — R7301 Impaired fasting glucose: Secondary | ICD-10-CM | POA: Diagnosis not present

## 2022-06-29 DIAGNOSIS — I10 Essential (primary) hypertension: Secondary | ICD-10-CM | POA: Diagnosis not present

## 2022-06-29 DIAGNOSIS — M2550 Pain in unspecified joint: Secondary | ICD-10-CM | POA: Diagnosis not present

## 2022-06-29 DIAGNOSIS — Z79899 Other long term (current) drug therapy: Secondary | ICD-10-CM | POA: Diagnosis not present

## 2022-06-29 DIAGNOSIS — Z6834 Body mass index (BMI) 34.0-34.9, adult: Secondary | ICD-10-CM | POA: Diagnosis not present

## 2022-07-25 DIAGNOSIS — I10 Essential (primary) hypertension: Secondary | ICD-10-CM | POA: Diagnosis not present

## 2022-07-25 DIAGNOSIS — E785 Hyperlipidemia, unspecified: Secondary | ICD-10-CM | POA: Diagnosis not present

## 2022-07-25 DIAGNOSIS — Z79899 Other long term (current) drug therapy: Secondary | ICD-10-CM | POA: Diagnosis not present

## 2022-07-26 DIAGNOSIS — Z6833 Body mass index (BMI) 33.0-33.9, adult: Secondary | ICD-10-CM | POA: Diagnosis not present

## 2022-07-26 DIAGNOSIS — I10 Essential (primary) hypertension: Secondary | ICD-10-CM | POA: Diagnosis not present

## 2022-07-26 DIAGNOSIS — Z1331 Encounter for screening for depression: Secondary | ICD-10-CM | POA: Diagnosis not present

## 2022-07-26 DIAGNOSIS — E785 Hyperlipidemia, unspecified: Secondary | ICD-10-CM | POA: Diagnosis not present

## 2022-07-26 DIAGNOSIS — Z0000 Encounter for general adult medical examination without abnormal findings: Secondary | ICD-10-CM | POA: Diagnosis not present

## 2022-07-30 DIAGNOSIS — B029 Zoster without complications: Secondary | ICD-10-CM | POA: Diagnosis not present

## 2022-10-24 DIAGNOSIS — R051 Acute cough: Secondary | ICD-10-CM | POA: Diagnosis not present

## 2022-10-24 DIAGNOSIS — R509 Fever, unspecified: Secondary | ICD-10-CM | POA: Diagnosis not present

## 2022-10-24 DIAGNOSIS — J09X2 Influenza due to identified novel influenza A virus with other respiratory manifestations: Secondary | ICD-10-CM | POA: Diagnosis not present

## 2022-10-24 DIAGNOSIS — M791 Myalgia, unspecified site: Secondary | ICD-10-CM | POA: Diagnosis not present

## 2022-10-24 DIAGNOSIS — R5383 Other fatigue: Secondary | ICD-10-CM | POA: Diagnosis not present

## 2022-10-24 DIAGNOSIS — R030 Elevated blood-pressure reading, without diagnosis of hypertension: Secondary | ICD-10-CM | POA: Diagnosis not present

## 2022-11-02 DIAGNOSIS — J988 Other specified respiratory disorders: Secondary | ICD-10-CM | POA: Diagnosis not present

## 2022-11-02 DIAGNOSIS — J019 Acute sinusitis, unspecified: Secondary | ICD-10-CM | POA: Diagnosis not present

## 2022-11-02 DIAGNOSIS — B9689 Other specified bacterial agents as the cause of diseases classified elsewhere: Secondary | ICD-10-CM | POA: Diagnosis not present

## 2022-11-23 DIAGNOSIS — H40053 Ocular hypertension, bilateral: Secondary | ICD-10-CM | POA: Diagnosis not present

## 2022-11-23 DIAGNOSIS — H33001 Unspecified retinal detachment with retinal break, right eye: Secondary | ICD-10-CM | POA: Diagnosis not present

## 2022-11-23 DIAGNOSIS — H33012 Retinal detachment with single break, left eye: Secondary | ICD-10-CM | POA: Diagnosis not present

## 2022-11-23 DIAGNOSIS — D3132 Benign neoplasm of left choroid: Secondary | ICD-10-CM | POA: Diagnosis not present

## 2022-11-23 DIAGNOSIS — H35372 Puckering of macula, left eye: Secondary | ICD-10-CM | POA: Diagnosis not present

## 2022-11-28 DIAGNOSIS — R7301 Impaired fasting glucose: Secondary | ICD-10-CM | POA: Diagnosis not present

## 2022-11-28 DIAGNOSIS — E785 Hyperlipidemia, unspecified: Secondary | ICD-10-CM | POA: Diagnosis not present

## 2022-11-28 DIAGNOSIS — D72829 Elevated white blood cell count, unspecified: Secondary | ICD-10-CM | POA: Diagnosis not present

## 2022-11-28 DIAGNOSIS — Z79899 Other long term (current) drug therapy: Secondary | ICD-10-CM | POA: Diagnosis not present

## 2022-12-07 DIAGNOSIS — Z6831 Body mass index (BMI) 31.0-31.9, adult: Secondary | ICD-10-CM | POA: Diagnosis not present

## 2022-12-07 DIAGNOSIS — R7989 Other specified abnormal findings of blood chemistry: Secondary | ICD-10-CM | POA: Diagnosis not present

## 2022-12-07 DIAGNOSIS — D72829 Elevated white blood cell count, unspecified: Secondary | ICD-10-CM | POA: Diagnosis not present

## 2022-12-07 DIAGNOSIS — R350 Frequency of micturition: Secondary | ICD-10-CM | POA: Diagnosis not present

## 2022-12-09 LAB — LAB REPORT - SCANNED: EGFR: 59

## 2022-12-14 ENCOUNTER — Other Ambulatory Visit: Payer: Self-pay | Admitting: Hematology and Oncology

## 2022-12-14 DIAGNOSIS — D72820 Lymphocytosis (symptomatic): Secondary | ICD-10-CM

## 2022-12-15 ENCOUNTER — Encounter: Payer: Self-pay | Admitting: Hematology and Oncology

## 2022-12-15 ENCOUNTER — Inpatient Hospital Stay: Payer: Medicare PPO

## 2022-12-15 ENCOUNTER — Inpatient Hospital Stay: Payer: Medicare PPO | Attending: Hematology and Oncology | Admitting: Hematology and Oncology

## 2022-12-15 ENCOUNTER — Other Ambulatory Visit: Payer: Self-pay

## 2022-12-15 VITALS — BP 194/64 | HR 61 | Temp 97.9°F | Resp 18 | Ht 62.0 in | Wt 177.6 lb

## 2022-12-15 DIAGNOSIS — C8510 Unspecified B-cell lymphoma, unspecified site: Secondary | ICD-10-CM

## 2022-12-15 DIAGNOSIS — I10 Essential (primary) hypertension: Secondary | ICD-10-CM | POA: Diagnosis not present

## 2022-12-15 DIAGNOSIS — D72820 Lymphocytosis (symptomatic): Secondary | ICD-10-CM

## 2022-12-15 DIAGNOSIS — D479 Neoplasm of uncertain behavior of lymphoid, hematopoietic and related tissue, unspecified: Secondary | ICD-10-CM | POA: Diagnosis not present

## 2022-12-15 DIAGNOSIS — R5383 Other fatigue: Secondary | ICD-10-CM

## 2022-12-15 DIAGNOSIS — D47Z9 Other specified neoplasms of uncertain behavior of lymphoid, hematopoietic and related tissue: Secondary | ICD-10-CM | POA: Diagnosis not present

## 2022-12-15 LAB — CBC: RBC: 4.08 (ref 3.87–5.11)

## 2022-12-15 LAB — CBC AND DIFFERENTIAL
Absolute Lymphocytes: 9.23
HCT: 38 (ref 36–46)
Hemoglobin: 12.7 (ref 12.0–16.0)
LYMPH%: 71 %
MCV: 93 (ref 81–99)
MONO%: 5 %
NEUT%: 24 %
Neutrophils Absolute: 3.12
Platelets: 153 10*3/uL (ref 150–400)
WBC: 13

## 2022-12-15 LAB — LACTATE DEHYDROGENASE: LDH: 158 U/L (ref 98–192)

## 2022-12-15 NOTE — Progress Notes (Signed)
[facility] [address] [place],  [place]  [address] [phone]  Clinic Day:  12/15/2022  Referring physician: Philemon Kingdom, MD   REASON FOR CONSULTATION:  Lymphocytosis  HISTORY OF PRESENT ILLNESS:  Kerri Garza is a 82 y.o. 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 She saw originally Dr. Melvyn Neth in April 2016 for leukocytosis, which was predominantly lymphocytes, dating back to December 2015. Her white count ranged from 11,900 to 19,800.  Flow cytometry of the peripheral blood revealed a monoclonal B-cell population consistent with marginal zone lymphoma, involving 54% of the cells.  This was positive for CD 19, CD 20, CD 21, CD 22, HLA DR, CD 11, CD 25, FLT 7, and lambda light chains and negative for CD 103.  Regular follow-up was recommended, as she did not have significant lymphocytosis, or any associated B symptoms, splenomegaly, or adenopathy.  She did not follow-up and was referred back and July 2017, at which time she saw Dr. Gilman Buttner.  Her white count was 19,200 with 34% neutrophils and 62% lymphocytes.  Serum protein electrophoresis was normal.  Quantitative immunoglobulins were normal.  She remained stable, so routine follow-up was recommend. She was seen again in July 2018 her white count was 19,500 with 25% neutrophils and 75% lymphocytes..  Dr. Gilman Buttner recommended annual follow-up at that time.  She was last seen in August 2019, at which time her white count had normalized at 6600 with 65% neutrophils, 19% lymphocytes, 10% monocytes and 4% eosinophils.  As that was the case, we recommended follow-up with Dr. Sudie Bailey with referral back as needed.  She has never had associated anemia, thrombocytopenia or splenomegaly.  She is referred back by Dr. Philemon Kingdom at this time.  On April 9,  her white count was 12,300 with 24% neutrophils and 67% lymphocytes, hemoglobin 13, MCV 94 platelets 173,000.  Review of her medical record reveals a white count of 10,000 with 28% neutrophils and 61% lymphocytes in June 2023.  In February 2023, her white count was 8.9 with 31% neutrophils and 56% lymphocytes. Her white count was 7.8 in the ER in January 2020 with 75% neutrophils and 14% lymphocytes.  A CT abdomen and pelvis at that time, did not reveal any acute findings, lymphadenopathy or hepatosplenomegaly.  She had an EGD and colonoscopy with Dr. 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  She understands that she is here for her increased white blood cells.  She states she has had several infections over the last several months beginning with shingles and December, flu in March, as well as a sinus infection.  Her only complaints are fatigue and back and hip pain.  She reports stress over a situation at church since December.  She denies B symptoms, such as appetite changes, weight loss, fevers, chills, or night sweats.  She denies palpable adenopathy.  She denies early satiety or abdominal pain.  REVIEW OF SYSTEMS:  Review of Systems  Constitutional:  Positive for fatigue. Negative for appetite change, chills, fever and unexpected weight change.  HENT:   Negative for lump/mass, mouth sores and sore throat.   Respiratory:  Negative for cough and shortness of breath.   Cardiovascular:  Negative for chest pain and leg swelling.  Gastrointestinal:  Negative for abdominal pain,  constipation, diarrhea, nausea and vomiting.  Endocrine: Negative for hot flashes.  Genitourinary:  Negative for difficulty urinating, dysuria, frequency and hematuria.   Musculoskeletal:  Positive for arthralgias and back pain. Negative for myalgias.  Skin:  Negative for rash.  Neurological:  Negative for  dizziness and headaches.  Hematological:  Negative for adenopathy. Does not bruise/bleed easily.  Psychiatric/Behavioral:  Negative for depression and sleep disturbance. The patient is not nervous/anxious.      VITALS:  Blood pressure (!) 194/64, pulse 61, temperature 97.9 F (36.6 C), temperature source Oral, resp. rate 18, height 5\' 2"  (1.575 m), weight 177 lb 9.6 oz (80.6 kg), SpO2 99 %.  Wt Readings from Last 3 Encounters:  12/15/22 177 lb 9.6 oz (80.6 kg)  02/04/19 187 lb (84.8 kg)  01/08/19 187 lb (84.8 kg)    Body mass index is 32.48 kg/m.  Performance status (ECOG): 1 - Symptomatic but completely ambulatory  PHYSICAL EXAM:  Physical Exam Vitals and nursing note reviewed.  Constitutional:      General: She is not in acute distress.    Appearance: Normal appearance.  HENT:     Head: Normocephalic and atraumatic.     Mouth/Throat:     Mouth: Mucous membranes are moist.     Pharynx: Oropharynx is clear. No oropharyngeal exudate or posterior oropharyngeal erythema.  Eyes:     General: No scleral icterus.    Extraocular Movements: Extraocular movements intact.     Conjunctiva/sclera: Conjunctivae normal.     Pupils: Pupils are equal, round, and reactive to light.  Cardiovascular:     Rate and Rhythm: Normal rate and regular rhythm.     Heart sounds: Normal heart sounds. No murmur heard.    No friction rub. No gallop.  Pulmonary:     Effort: Pulmonary effort is normal.     Breath sounds: Normal breath sounds. No wheezing, rhonchi or rales.  Abdominal:     General: There is no distension.     Palpations: Abdomen is soft. There is no mass.     Tenderness: There is no abdominal tenderness.  Musculoskeletal:        General: Normal range of motion.     Cervical back: Normal range of motion and neck supple. No tenderness.     Right lower leg: No edema.     Left lower leg: No edema.  Lymphadenopathy:     Cervical: No cervical adenopathy.  Skin:    General: Skin is warm  and dry.     Coloration: Skin is not jaundiced.     Findings: No rash.  Neurological:     Mental Status: She is alert and oriented to person, place, and time.     Cranial Nerves: No cranial nerve deficit.  Psychiatric:        Mood and Affect: Mood normal.        Behavior: Behavior normal.        Thought Content: Thought content normal.      LABS:      Latest Ref Rng & Units 12/15/2022   12:00 AM  CBC  WBC  13.0      Hemoglobin 12.0 - 16.0 37.9      Hematocrit 36 - 46 38      Platelets 150 - 400 K/uL 153         This result is from an external source.       No data to display  No results found for: "LDH"  STUDIES:     HISTORY:  According to her medical record, she has multiple medical comorbidities, including, but not limited to, diabetes, hypertension, hyperlipidemia, degenerative disc and joint disease and anxiety and depression.  She states she does not have diabetes and is managing with her diet.  Her blood pressure is elevated here today, but she states her blood pressure is good at Dr. Donita Brooks office, so does not take her blood pressure medicine.  She states she is supposed to take her cholesterol medicine weekly, with has not remembered to do that.  She states she does not have anxiety and depression, but is just stressed over the issues at her church   Past Medical History:  Diagnosis Date   Anxiety    Aortic atherosclerosis (HCC)    Bradycardia    Cerebral vascular disease    Degenerative disc disease, lumbar    Depression    Diabetes mellitus without complication (HCC)    Diverticulitis    Essential tremor    Hyperlipidemia    Hypertension    Ischemic colitis (HCC)    Lymphoma (HCC)    she was diagnosed several years ago for about 2.5 years but then labs were completely normal since then. Received no treatment.   Marginal zone lymphoma (HCC)    Nocturnal hypoxia    Osteoarthritis    Retinal detachment    bilateral   Rosacea    Skin  cancer    Vitamin D deficiency     Past Surgical History:  Procedure Laterality Date   CATARACT EXTRACTION Bilateral    DILATION AND CURETTAGE OF UTERUS     RETINAL DETACHMENT SURGERY Bilateral    TONSILLECTOMY  1947   TONSILLECTOMY AND ADENOIDECTOMY      Family History  Problem Relation Age of Onset   High blood pressure Mother    Stroke Mother    Parkinson's disease Father    Dementia Maternal Grandmother    Cancer Maternal Grandfather        thinks it was pancreatic   Heart disease Paternal Grandmother    Suicidality Paternal Grandfather     Social History:  reports that she has never smoked. She has never used smokeless tobacco. She reports that she does not currently use alcohol. She reports that she does not use drugs. The patient is alone today.  She is widowed.  She has 2 sons.  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  Allergies:  Allergies  Allergen Reactions   Bactrim Ds [Sulfamethoxazole-Trimethoprim]     Irritability & lip swelling   Lisinopril     URI symptoms   Losartan Potassium     Fingers felt numb   Metoprolol     Made her tired   Moxifloxacin Other (See Comments)    Eye irritation  Eye irritation  Eye irritation   Propranolol     Hair loss   Ranitidine Hcl     Hair loss    Current Medications: Current Outpatient Medications  Medication Sig Dispense Refill   Ergocalciferol (VITAMIN D2 PO) Take 2,000 Units by mouth daily.      Omega-3 Fatty Acids (OMEGA-3 EPA FISH OIL PO) Take by mouth.     Turmeric, Curcuma Longa, (CURCUMIN) POWD by miscellaneous route.     amLODipine (NORVASC) 2.5 MG tablet Take by mouth. (Patient not taking: Reported on 12/15/2022)     aspirin 325 MG tablet Take 325 mg by mouth daily.  Patient reports taking twice daily     atorvastatin (LIPITOR) 10 MG tablet 1 tablet Orally Once a week 30 days (Patient not taking: Reported on 12/15/2022)     cetirizine (ZYRTEC) 10 MG tablet Take 1 tablet by  mouth daily. (Patient not taking: Reported on 12/15/2022)     Coenzyme Q10 10 MG capsule Take by mouth. (Patient not taking: Reported on 12/15/2022)     FIBER PO Take 2 each by mouth. Fiber well gummies 5 gram each     fluticasone (FLONASE) 50 MCG/ACT nasal spray Place into both nostrils. (Patient not taking: Reported on 12/15/2022)     lisinopril (ZESTRIL) 5 MG tablet Take by mouth. (Patient not taking: Reported on 12/15/2022)     metroNIDAZOLE (METROCREAM) 0.75 % cream Apply 1 application topically daily.     nystatin (MYCOSTATIN/NYSTOP) powder Apply topically as needed. Under breasts     spironolactone (ALDACTONE) 25 MG tablet Take 6.25 mg by mouth daily as needed (swelling). (Patient not taking: Reported on 12/15/2022)     traMADol (ULTRAM) 50 MG tablet Take 1 tablet by mouth 2 (two) times daily as needed. (Patient not taking: Reported on 12/15/2022)     TURMERIC CURCUMIN PO Take by mouth.     UNABLE TO FIND Take 500 mg by mouth daily. Med Name: veggie caps by healthy origins (Patient not taking: Reported on 12/15/2022)     UNKNOWN TO PATIENT Medication she takes as needed for rosacea     valACYclovir (VALTREX) 1000 MG tablet Take 1,000 mg by mouth 3 (three) times daily. (Patient not taking: Reported on 12/15/2022)     WELCHOL 625 MG tablet TAKE THREE TABLETS TWICE DAILY (Patient not taking: Reported on 12/15/2022)  4   No current facility-administered medications for this visit.     ASSESSMENT & PLAN:   Assessment:  Kerri Garza is a 82 y.o. 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  This initially improved, but she now has increasing lymphocytosis over the past year.  She has had multiple infections over the past several months.  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  She is otherwise asymptomatic.  Plan: She does not require treatment. I will obtain quantitative  immunoglobulins today.  I do not feel she would benefit for imaging at this time. We will plan to see her back in 6 weeks with a repeat CBC to be sure she does not have precipitous increase in her lymphocytosis or new symptomatology.  Most likely, this will be more for her peace of mind, as she is still not likely to need treatment.  I discussed the assessment and treatment plan with the patient.  The patient was provided an opportunity to ask questions and all were answered.  The patient agreed with the plan and demonstrated an understanding of the instructions.   Thank you for the referral.    I provided 45 minutes of face-to-face time during this encounter and > 50% was spent counseling as documented under my assessment and plan.  The patient was staffed and plan formulated with Dr. Gilman Buttner.    Adah Perl, PA-C   Physician Assistant [facility] [place] [phone]

## 2022-12-17 LAB — IGG, IGA, IGM
IgA: 158 mg/dL (ref 64–422)
IgG (Immunoglobin G), Serum: 1014 mg/dL (ref 586–1602)
IgM (Immunoglobulin M), Srm: 102 mg/dL (ref 26–217)

## 2022-12-18 ENCOUNTER — Telehealth: Payer: Self-pay

## 2022-12-18 DIAGNOSIS — L570 Actinic keratosis: Secondary | ICD-10-CM | POA: Diagnosis not present

## 2022-12-18 DIAGNOSIS — L814 Other melanin hyperpigmentation: Secondary | ICD-10-CM | POA: Diagnosis not present

## 2022-12-18 DIAGNOSIS — D225 Melanocytic nevi of trunk: Secondary | ICD-10-CM | POA: Diagnosis not present

## 2022-12-18 DIAGNOSIS — D2239 Melanocytic nevi of other parts of face: Secondary | ICD-10-CM | POA: Diagnosis not present

## 2022-12-18 DIAGNOSIS — L821 Other seborrheic keratosis: Secondary | ICD-10-CM | POA: Diagnosis not present

## 2022-12-18 NOTE — Telephone Encounter (Signed)
-----   Message from Adah Perl, PA-C sent at 12/18/2022  9:07 AM EDT ----- Please let her know that her immunoglobulins were normal. Thanks

## 2022-12-18 NOTE — Telephone Encounter (Signed)
Patient notified and voiced understanding.

## 2023-01-23 ENCOUNTER — Other Ambulatory Visit: Payer: Self-pay | Admitting: Oncology

## 2023-01-23 DIAGNOSIS — C884 Extranodal marginal zone B-cell lymphoma of mucosa-associated lymphoid tissue [MALT-lymphoma]: Secondary | ICD-10-CM | POA: Insufficient documentation

## 2023-01-25 NOTE — Progress Notes (Signed)
[facility] [address] [place],  [place]  [address] [phone]  Clinic Day: 01/26/2023  Referring physician: Philemon Kingdom, MD   REASON FOR CONSULTATION:  CC: Lymphocytosis  Current Treatment: Surveillance   HISTORY OF PRESENT ILLNESS:  Kerri Garza is a 82 y.o. 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 She saw originally Dr. Melvyn Neth in April 2016 for leukocytosis, which was predominantly lymphocytes, dating back to December 2015. Her white count ranged from 11,900 to 19,800.  Flow cytometry of the peripheral blood revealed a monoclonal B-cell population consistent with marginal zone lymphoma, involving 54% of the cells.  This was positive for CD 19, CD 20, CD 21, CD 22, HLA DR, CD 11, CD 25, FLT 7, and lambda light chains and negative for CD 103.  Regular follow-up was recommended, as she did not have significant lymphocytosis, or any associated B symptoms, splenomegaly, or adenopathy.  She did not follow-up and was referred back and July 2017, at which time she saw Dr. Gilman Buttner.  Her white count was 19,200 with 34% neutrophils and 62% lymphocytes.  Serum protein electrophoresis was normal.  Quantitative immunoglobulins were normal.  She remained stable, so routine follow-up was recommend. She was seen again in July 2018 her white count was 19,500 with 25% neutrophils and 75% lymphocytes..  Dr. Gilman Buttner recommended annual follow-up at that time.  She was last seen in August 2019, at which time her white count had normalized at 6600 with 65% neutrophils, 19% lymphocytes, 10% monocytes and 4% eosinophils.  As that was the case, we recommended follow-up with Dr. Sudie Bailey with referral back as needed.  She has never had associated anemia, thrombocytopenia or splenomegaly.  She is referred back by Dr. Philemon Kingdom at this time.  On April 9, her white count was 12,300 with 24% neutrophils and 67% lymphocytes, hemoglobin 13, MCV 94 platelets 173,000.  Review of her medical record reveals a white count of 10,000 with 28% neutrophils and 61% lymphocytes in June 2023.  In February 2023, her white count was 8.9 with 31% neutrophils and 56% lymphocytes. Her white count was 7.8 in the ER in January 2020 with 75% neutrophils and 14% lymphocytes.  A CT abdomen and pelvis at that time, did not reveal any acute findings, lymphadenopathy or hepatosplenomegaly.  She had an EGD and colonoscopy with Dr. 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  She states she has had several infections over the last several months beginning with shingles and December, flu in March, as well as a sinus infection.  Her only complaints are fatigue and back and hip pain.  She reports stress over a situation at church since December.  She denies B symptoms, such as appetite changes, weight loss, fevers, chills, or night sweats.  She denies palpable adenopathy.  She denies early satiety or abdominal pain.   INTERVAL HISTORY:  Kerri Garza is here for a follow-up appointment for her lymphocytosis. I do feel she has a diagnosis of marginal zone lymphoma which hs been present since 2016 and has a very indolent course.  Patient states that she has been stressed lately and complains of lower back pain. Dr. Sudie Bailey does routine labs for her and referred her to return to [place] because of the increasing lymphocytosis. We  did check quantitative immunoglobulins and they are normal. She was last seen by [facility] on 12/15/2022 but was last seen by me in 2019.She states she has had several infections over the last several months beginning with shingles and December, flu in March, as well as a sinus infection.  In 12/15/2022 her WBC was 13.0 with a 24%  neutrophils and 71% lymphocytes but her platelet counts and hemoglobin was normal. I have tried to reassure her that she needs surveillance only and no therapy at this time. It is quite likely that she may never need treatment. 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 I advised her to keep her immune system healthy and gave her different ways to do that considering that she is highly stressed right now. I advised her that the best strategy is to let her body continue to do what it is doing, I informed her that I could prescribe a pill but it would not make her feel better or live longer.  Her labs today are pending. I will see her back in 4 months with repeat CBC and CMP. We will try to alternate appointments with Dr. Sudie Bailey and keep her informed of the results. She denies signs of infection such as sore throat, sinus drainage, cough, or urinary symptoms.  She denies fevers or recurrent chills. She denies pain. She denies nausea, vomiting, chest pain, dyspnea or cough. Her appetite is very good and she tries to eat healthier and her weight has increased 2 pounds over last month .  REVIEW OF SYSTEMS:  Review of Systems  Constitutional:  Positive for fatigue. Negative for appetite change, chills, diaphoresis, fever and unexpected weight change.  HENT:  Negative.  Negative for hearing loss, lump/mass, mouth sores, nosebleeds, sore throat, tinnitus, trouble swallowing and voice change.   Eyes: Negative.  Negative for eye problems and icterus.  Respiratory: Negative.  Negative for chest tightness, cough, hemoptysis, shortness of breath and wheezing.   Cardiovascular: Negative.  Negative for chest pain, leg swelling and palpitations.  Gastrointestinal: Negative.  Negative for abdominal distention, abdominal pain, blood in stool, constipation, diarrhea, nausea, rectal pain and vomiting.  Endocrine: Negative.  Negative for hot flashes.  Genitourinary: Negative.  Negative for bladder incontinence,  difficulty urinating, dyspareunia, dysuria, frequency, hematuria, menstrual problem, nocturia, pelvic pain, vaginal bleeding and vaginal discharge.   Musculoskeletal:  Positive for arthralgias and back pain (lower back pain). Negative for flank pain, gait problem, myalgias, neck pain and neck stiffness.  Skin: Negative.  Negative for itching, rash and wound.  Neurological:  Negative for dizziness, extremity weakness, gait problem, headaches, light-headedness, numbness, seizures and speech difficulty.  Hematological: Negative.  Negative for adenopathy. Does not bruise/bleed easily.  Psychiatric/Behavioral:  Negative for confusion, decreased concentration, depression, sleep disturbance and suicidal ideas. The patient is nervous/anxious.        Stressed     VITALS:  Blood pressure (!) 168/64, pulse 78, temperature 97.8 F (36.6 C), temperature source Oral, resp. rate 18, height 5\' 2"  (1.575 m), weight 179 lb 6.4 oz (81.4 kg), SpO2 98 %.  Wt Readings from Last 3 Encounters:  01/26/23 179 lb 6.4 oz (81.4 kg)  12/15/22 177 lb 9.6 oz (80.6 kg)  02/04/19 187 lb (84.8 kg)    Body mass index is 32.81 kg/m.  Performance status (ECOG): 1 - Symptomatic but completely ambulatory  PHYSICAL EXAM:  Physical Exam Vitals and nursing note reviewed.  Constitutional:      General: She is not in acute  distress.    Appearance: Normal appearance. She is normal weight. She is not ill-appearing, toxic-appearing or diaphoretic.  HENT:     Head: Normocephalic and atraumatic.     Right Ear: Tympanic membrane, ear canal and external ear normal. There is no impacted cerumen.     Left Ear: Tympanic membrane, ear canal and external ear normal. There is no impacted cerumen.     Nose: Nose normal. No congestion or rhinorrhea.     Mouth/Throat:     Mouth: Mucous membranes are moist.     Pharynx: Oropharynx is clear. No oropharyngeal exudate or posterior oropharyngeal erythema.  Eyes:     General: No scleral icterus.        Right eye: No discharge.        Left eye: No discharge.     Extraocular Movements: Extraocular movements intact.     Conjunctiva/sclera: Conjunctivae normal.     Pupils: Pupils are equal, round, and reactive to light.  Neck:     Vascular: No carotid bruit.  Cardiovascular:     Rate and Rhythm: Normal rate and regular rhythm.     Pulses: Normal pulses.     Heart sounds: Normal heart sounds. No murmur heard.    No friction rub. No gallop.  Pulmonary:     Effort: Pulmonary effort is normal. No respiratory distress.     Breath sounds: Normal breath sounds. No stridor. No wheezing, rhonchi or rales.  Chest:     Chest wall: No tenderness.  Abdominal:     General: Bowel sounds are normal. There is no distension.     Palpations: Abdomen is soft. There is no hepatomegaly, splenomegaly or mass.     Tenderness: There is no abdominal tenderness. There is no right CVA tenderness, left CVA tenderness, guarding or rebound.     Hernia: No hernia is present.  Musculoskeletal:        General: No swelling, tenderness, deformity or signs of injury. Normal range of motion.     Cervical back: Normal range of motion and neck supple. No rigidity or tenderness.     Right lower leg: No edema.     Left lower leg: No edema.  Lymphadenopathy:     Cervical: No cervical adenopathy.     Right cervical: No superficial, deep or posterior cervical adenopathy.    Left cervical: No superficial, deep or posterior cervical adenopathy.     Upper Body:     Right upper body: No supraclavicular, axillary or pectoral adenopathy.     Left upper body: No supraclavicular, axillary or pectoral adenopathy.  Skin:    General: Skin is warm and dry.     Coloration: Skin is not jaundiced or pale.     Findings: No bruising, erythema, lesion or rash.  Neurological:     General: No focal deficit present.     Mental Status: She is alert and oriented to person, place, and time. Mental status is at baseline.     Cranial  Nerves: No cranial nerve deficit.     Sensory: No sensory deficit.     Motor: No weakness.     Coordination: Coordination normal.     Gait: Gait normal.     Deep Tendon Reflexes: Reflexes normal.  Psychiatric:        Mood and Affect: Mood normal.        Behavior: Behavior normal.        Thought Content: Thought content normal.  Judgment: Judgment normal.     LABS:      Latest Ref Rng & Units 01/26/2023    4:10 PM 12/15/2022   12:00 AM  CBC  WBC 4.0 - 10.5 K/uL 14.8  13.0      Hemoglobin 12.0 - 15.0 g/dL 16.1  09.6  C     Hematocrit 36.0 - 46.0 % 39.1  38      Platelets 150 - 400 K/uL 175  153        C Corrected result   This result is from an external source.   Component Ref Range & Units 12/15/2022  IgG (Immunoglobin G), Serum 586 - 1,602 mg/dL 0,454  IgA 64 - 098 mg/dL 119  IgM (Immunoglobulin M), Srm 26 - 217 mg/dL 147       No data to display          Lab Results  Component Value Date   LDH 158 12/15/2022    STUDIES:     HISTORY:  According to her medical record, she has multiple medical comorbidities, including, but not limited to, diabetes, hypertension, hyperlipidemia, degenerative disc and joint disease and anxiety and depression.  She states she does not have diabetes and is managing with her diet.  Her blood pressure is elevated here today, but she states her blood pressure is good at Dr. Donita Brooks office, so does not take her blood pressure medicine.  She states she is supposed to take her cholesterol medicine weekly, with has not remembered to do that.  She states she does not have anxiety and depression, but is just stressed over the issues at her church   Past Medical History:  Diagnosis Date   Anxiety    Aortic atherosclerosis (HCC)    Bradycardia    Cerebral vascular disease    Degenerative disc disease, lumbar    Depression    Diabetes mellitus without complication (HCC)    Diverticulitis    Essential tremor    Hyperlipidemia     Hypertension    Ischemic colitis (HCC)    Lymphoma (HCC)    she was diagnosed several years ago for about 2.5 years but then labs were completely normal since then. Received no treatment.   Marginal zone lymphoma (HCC)    Nocturnal hypoxia    Osteoarthritis    Retinal detachment    bilateral   Rosacea    Skin cancer    Vitamin D deficiency     Past Surgical History:  Procedure Laterality Date   CATARACT EXTRACTION Bilateral    DILATION AND CURETTAGE OF UTERUS     RETINAL DETACHMENT SURGERY Bilateral    TONSILLECTOMY  1947   TONSILLECTOMY AND ADENOIDECTOMY      Family History  Problem Relation Age of Onset   High blood pressure Mother    Stroke Mother    Parkinson's disease Father    Dementia Maternal Grandmother    Cancer Maternal Grandfather        thinks it was pancreatic   Heart disease Paternal Grandmother    Suicidality Paternal Grandfather     Social History:  reports that she has never smoked. She has never used smokeless tobacco. She reports that she does not currently use alcohol. She reports that she does not use drugs. The patient is alone today.  She is widowed.  She has 2 sons.  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  Allergies:  Allergies  Allergen Reactions   Bactrim  Ds [Sulfamethoxazole-Trimethoprim]     Irritability & lip swelling   Lisinopril     URI symptoms   Losartan Potassium     Fingers felt numb   Metoprolol     Made her tired   Moxifloxacin Other (See Comments)    Eye irritation  Eye irritation  Eye irritation   Propranolol     Hair loss   Ranitidine Hcl     Hair loss    Current Medications: Current Outpatient Medications  Medication Sig Dispense Refill   aspirin 325 MG tablet Take 325 mg by mouth daily. Patient reports taking twice daily     Coenzyme Q10 10 MG capsule Take by mouth. (Patient not taking: Reported on 12/15/2022)     Ergocalciferol (VITAMIN D2 PO) Take 2,000 Units by mouth  daily.      FIBER PO Take 2 each by mouth. Fiber well gummies 5 gram each     Omega-3 Fatty Acids (OMEGA-3 EPA FISH OIL PO) Take by mouth.     TURMERIC CURCUMIN PO Take by mouth.     Turmeric, Curcuma Longa, (CURCUMIN) POWD by miscellaneous route.     No current facility-administered medications for this visit.     ASSESSMENT & PLAN:  Assessment:  Low-grade B-cell Lymphoproliferative disorder This likely representing marginal zone lymphoma diagnosed in 2016 and has a very indolent course. She now has increasing lymphocytosis but normal hemoglobin and platelet counts. I have reassured her that this is something she can live with and will not likely need treatment.   Plan: Dr. Sudie Bailey does routine labs for her and referred her to return to [place] because of the increasing lymphocytosis. We did check quantitative immunoglobulins and they are normal. She was last seen by [facility] on 12/15/2022 but was last seen by me in 2019. In 12/15/2022 her WBC was 13.0 with a 24% neutrophils and 71% lymphocytes but her platelet counts and hemoglobin was normal. I have tried to reassure her that she needs surveillance only and no therapy at this time. It is quite likely that she may never need treatment. I believe she has a slow growing type of lymphoma. I advised her to keep her immune system healthy and gave her different ways to do that considering that she is highly stressed right now. I advised her that the best strategy is to leave her alone and continue watch and wait strategy. I informed her that I could prescribe a pill but it would not make her feel better or live longer.  Her labs today are pending. I will see her back in 4 months with repeat CBC and CMP. We will try to alternate appointments with Dr. Sudie Bailey and keep her informed of the results. I discussed the assessment and treatment plan with the patient.  The patient was provided an opportunity to ask questions and all were answered.  The patient agreed  with the plan and demonstrated an understanding of the instructions.   I provided 23  minutes of face-to-face time during this encounter and > 50% was spent counseling as documented under my assessment and plan.  The patient was staffed and plan formulated with Dr. Gilman Buttner.    Dellia Beckwith, MD   Physician Assistant [facility] [phone]    Junie Spencer as a scribe for Dellia Beckwith, MD.,have documented all relevant documentation on the behalf of Dellia Beckwith, MD,as directed by  Dellia Beckwith, MD while in the presence of Gardiner Fanti  Gilman Buttner, MD.

## 2023-01-26 ENCOUNTER — Inpatient Hospital Stay: Payer: Medicare PPO | Attending: Hematology and Oncology | Admitting: Oncology

## 2023-01-26 ENCOUNTER — Inpatient Hospital Stay: Payer: Medicare PPO

## 2023-01-26 ENCOUNTER — Encounter: Payer: Self-pay | Admitting: Oncology

## 2023-01-26 VITALS — BP 168/64 | HR 78 | Temp 97.8°F | Resp 18 | Ht 62.0 in | Wt 179.4 lb

## 2023-01-26 DIAGNOSIS — D72820 Lymphocytosis (symptomatic): Secondary | ICD-10-CM | POA: Diagnosis not present

## 2023-01-26 DIAGNOSIS — C884 Extranodal marginal zone B-cell lymphoma of mucosa-associated lymphoid tissue [MALT-lymphoma]: Secondary | ICD-10-CM

## 2023-01-26 DIAGNOSIS — D47Z9 Other specified neoplasms of uncertain behavior of lymphoid, hematopoietic and related tissue: Secondary | ICD-10-CM | POA: Diagnosis not present

## 2023-01-26 LAB — CBC WITH DIFFERENTIAL (CANCER CENTER ONLY)
Abs Immature Granulocytes: 0.03 10*3/uL (ref 0.00–0.07)
Basophils Absolute: 0.1 10*3/uL (ref 0.0–0.1)
Basophils Relative: 0 %
Eosinophils Absolute: 0.2 10*3/uL (ref 0.0–0.5)
Eosinophils Relative: 1 %
HCT: 39.1 % (ref 36.0–46.0)
Hemoglobin: 12.6 g/dL (ref 12.0–15.0)
Immature Granulocytes: 0 %
Lymphocytes Relative: 69 %
Lymphs Abs: 10.1 10*3/uL — ABNORMAL HIGH (ref 0.7–4.0)
MCH: 30.4 pg (ref 26.0–34.0)
MCHC: 32.2 g/dL (ref 30.0–36.0)
MCV: 94.4 fL (ref 80.0–100.0)
Monocytes Absolute: 1.3 10*3/uL — ABNORMAL HIGH (ref 0.1–1.0)
Monocytes Relative: 8 %
Neutro Abs: 3.3 10*3/uL (ref 1.7–7.7)
Neutrophils Relative %: 22 %
Platelet Count: 175 10*3/uL (ref 150–400)
RBC: 4.14 MIL/uL (ref 3.87–5.11)
RDW: 13.7 % (ref 11.5–15.5)
WBC Count: 14.8 10*3/uL — ABNORMAL HIGH (ref 4.0–10.5)
nRBC: 0 % (ref 0.0–0.2)

## 2023-01-30 ENCOUNTER — Telehealth: Payer: Self-pay | Admitting: Oncology

## 2023-01-30 ENCOUNTER — Telehealth: Payer: Self-pay

## 2023-01-30 ENCOUNTER — Other Ambulatory Visit: Payer: Self-pay | Admitting: Oncology

## 2023-01-30 DIAGNOSIS — C884 Extranodal marginal zone B-cell lymphoma of mucosa-associated lymphoid tissue [MALT-lymphoma]: Secondary | ICD-10-CM

## 2023-01-30 NOTE — Telephone Encounter (Signed)
-----   Message from Christine H McCarty, MD sent at 01/29/2023  3:07 PM EDT ----- Regarding: call Tell her White cells just a little higher at 14.8, we'll just keep watching, no cause for concern  

## 2023-01-30 NOTE — Telephone Encounter (Signed)
-----   Message from Dellia Beckwith, MD sent at 01/29/2023  3:07 PM EDT ----- Regarding: call Tell her White cells just a little higher at 14.8, we'll just keep watching, no cause for concern

## 2023-01-30 NOTE — Telephone Encounter (Signed)
Patient has been scheduled. Aware of appt date and time   Scheduling Message Entered by Gery Pray H on 01/30/2023 at  9:11 AM Priority: Routine <No visit type provided>  Department: [facility]  Provider:  Scheduling Notes:  RT 4 months with labs

## 2023-01-30 NOTE — Telephone Encounter (Signed)
Patient notified of labs results 

## 2023-01-30 NOTE — Telephone Encounter (Signed)
Attempted to contact patient. No answer. 

## 2023-04-06 DIAGNOSIS — W57XXXA Bitten or stung by nonvenomous insect and other nonvenomous arthropods, initial encounter: Secondary | ICD-10-CM | POA: Diagnosis not present

## 2023-04-06 DIAGNOSIS — L03319 Cellulitis of trunk, unspecified: Secondary | ICD-10-CM | POA: Diagnosis not present

## 2023-04-06 DIAGNOSIS — S20462A Insect bite (nonvenomous) of left back wall of thorax, initial encounter: Secondary | ICD-10-CM | POA: Diagnosis not present

## 2023-04-12 DIAGNOSIS — R0982 Postnasal drip: Secondary | ICD-10-CM | POA: Diagnosis not present

## 2023-04-12 DIAGNOSIS — Z1231 Encounter for screening mammogram for malignant neoplasm of breast: Secondary | ICD-10-CM | POA: Diagnosis not present

## 2023-04-12 DIAGNOSIS — D72829 Elevated white blood cell count, unspecified: Secondary | ICD-10-CM | POA: Diagnosis not present

## 2023-04-12 DIAGNOSIS — R7301 Impaired fasting glucose: Secondary | ICD-10-CM | POA: Diagnosis not present

## 2023-04-12 DIAGNOSIS — Z6833 Body mass index (BMI) 33.0-33.9, adult: Secondary | ICD-10-CM | POA: Diagnosis not present

## 2023-04-13 LAB — CBC AND DIFFERENTIAL
HCT: 36 (ref 36–46)
Hemoglobin: 11.8 — AB (ref 12.0–16.0)
Platelets: 158 10*3/uL (ref 150–400)
WBC: 9

## 2023-04-13 LAB — CBC: RBC: 3.89 (ref 3.87–5.11)

## 2023-04-18 DIAGNOSIS — L03011 Cellulitis of right finger: Secondary | ICD-10-CM | POA: Diagnosis not present

## 2023-05-29 ENCOUNTER — Other Ambulatory Visit: Payer: Self-pay | Admitting: Oncology

## 2023-05-29 ENCOUNTER — Inpatient Hospital Stay: Payer: Medicare PPO

## 2023-05-29 ENCOUNTER — Inpatient Hospital Stay: Payer: Medicare PPO | Attending: Oncology | Admitting: Oncology

## 2023-05-29 ENCOUNTER — Encounter: Payer: Self-pay | Admitting: Oncology

## 2023-05-29 VITALS — BP 142/68 | HR 60 | Temp 97.6°F | Resp 18 | Ht 62.0 in | Wt 181.8 lb

## 2023-05-29 DIAGNOSIS — C884 Extranodal marginal zone b-cell lymphoma of mucosa-associated lymphoid tissue (malt-lymphoma) not having achieved remission: Secondary | ICD-10-CM

## 2023-05-29 DIAGNOSIS — D649 Anemia, unspecified: Secondary | ICD-10-CM | POA: Diagnosis not present

## 2023-05-29 LAB — BASIC METABOLIC PANEL
BUN: 26 — AB (ref 4–21)
CO2: 28 — AB (ref 13–22)
Chloride: 102 (ref 99–108)
Creatinine: 0.9 (ref 0.5–1.1)
Glucose: 102
Potassium: 4.6 meq/L (ref 3.5–5.1)
Sodium: 136 — AB (ref 137–147)

## 2023-05-29 LAB — CBC AND DIFFERENTIAL
HCT: 40 (ref 36–46)
Hemoglobin: 13.2 (ref 12.0–16.0)
Neutrophils Absolute: 4.87
Platelets: 168 10*3/uL (ref 150–400)
WBC: 17.4

## 2023-05-29 LAB — HEPATIC FUNCTION PANEL
ALT: 17 U/L (ref 7–35)
AST: 24 (ref 13–35)
Alkaline Phosphatase: 66 (ref 25–125)
Bilirubin, Total: 0.5

## 2023-05-29 LAB — COMPREHENSIVE METABOLIC PANEL
Albumin: 4.5 (ref 3.5–5.0)
Calcium: 9.6 (ref 8.7–10.7)
EGFR: 60

## 2023-05-29 LAB — CBC: RBC: 4.31 (ref 3.87–5.11)

## 2023-05-29 NOTE — Progress Notes (Signed)
Kerri Garza [address] [place],  [place]  [address] [phone]  Clinic Day: 05/29/23  Referring physician: Philemon Kingdom, MD   REASON FOR CONSULTATION:  CC: Lymphocytosis  Current Treatment: Surveillance   HISTORY OF PRESENT ILLNESS:  Kerri Garza is a 82 y.o. 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 She saw originally Dr. Melvyn Neth in April 2016 for leukocytosis, which was predominantly lymphocytes, dating back to December 2015. Her white count ranged from 11,900 to 19,800.  Flow cytometry of the peripheral blood revealed a monoclonal B-cell population consistent with marginal zone lymphoma, involving 54% of the cells.  This was positive for CD 19, CD 20, CD 21, CD 22, HLA DR, CD 11, CD 25, FLT 7, and lambda light chains and negative for CD 103.  Regular follow-up was recommended, as she did not have significant lymphocytosis, or any associated B symptoms, splenomegaly, or adenopathy.  She did not follow-up and was referred back and July 2017, at which time she saw Dr. Gilman Buttner.  Her white count was 19,200 with 34% neutrophils and 62% lymphocytes.  Serum protein electrophoresis was normal.  Quantitative immunoglobulins were normal.  She remained stable, so routine follow-up was recommend. She was seen again in July 2018 her white count was 19,500 with 25% neutrophils and 75% lymphocytes..  Dr. Gilman Buttner recommended annual follow-up at that time.  She was last seen in August 2019, at which time her white count had normalized at 6600 with 65% neutrophils, 19% lymphocytes, 10% monocytes and 4% eosinophils.  As that was the case, we recommended follow-up with Dr. Sudie Bailey with referral back as needed.  She has never had associated anemia, thrombocytopenia or splenomegaly.  She is referred back by Dr. Philemon Kingdom at this time.  On April 9, her white count was 12,300 with 24% neutrophils and 67% lymphocytes, hemoglobin 13, MCV 94 platelets 173,000.  Review of her medical record reveals a white count of 10,000 with 28% neutrophils and 61% lymphocytes in June 2023.  In February 2023, her white count was 8.9 with 31% neutrophils and 56% lymphocytes. Her white count was 7.8 in the ER in January 2020 with 75% neutrophils and 14% lymphocytes.  A CT abdomen and pelvis at that time, did not reveal any acute findings, lymphadenopathy or hepatosplenomegaly.  She had an EGD and colonoscopy with Dr. 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  She states she has had several infections over the last several months beginning with shingles and December, flu in March, as well as a sinus infection.  Her only complaints are fatigue and back and hip pain.  She reports stress over a situation at church since December.  She denies B symptoms, such as appetite changes, weight loss, fevers, chills, or night sweats.  She denies palpable adenopathy.  She denies early satiety or abdominal pain.   INTERVAL HISTORY:  [name] is here for a follow-up appointment for her lymphocytosis. I do feel she has a diagnosis of marginal zone lymphoma which hs been present since 2016 and has a very indolent course. Patient states that she feels well and has no complaints of pain. She did have shingles earlier this year. She inquired about the signs of being anemic and I informed her that being tired, cold,  and short of breath are common symptoms of anemia. She will see Dr. Sudie Bailey in December. Her WBC was 9.0, hemoglobin was low at 11.8, and platelet count at 158,000 as of 04/13/2023. If her anemia is any worse I will add evaluation with B-12, folate, iron studies. Her labs today are pending, I will send a copy of her labs to Dr.  Sudie Bailey. I will see her back in 4 months with CBC and CMP.  She denies signs of infection such as sore throat, sinus drainage, cough, or urinary symptoms.  She denies fevers or recurrent chills. She denies pain. She denies nausea, vomiting, chest pain, dyspnea or cough. Her appetite is very good and her weight has increased 2 pounds over last 4 months .  REVIEW OF SYSTEMS:  Review of Systems  Constitutional:  Positive for fatigue. Negative for appetite change, chills, diaphoresis, fever and unexpected weight change.  HENT:  Negative.  Negative for hearing loss, lump/mass, mouth sores, nosebleeds, sore throat, tinnitus, trouble swallowing and voice change.   Eyes: Negative.  Negative for eye problems and icterus.  Respiratory: Negative.  Negative for chest tightness, cough, hemoptysis, shortness of breath and wheezing.   Cardiovascular: Negative.  Negative for chest pain, leg swelling and palpitations.  Gastrointestinal: Negative.  Negative for abdominal distention, abdominal pain, blood in stool, constipation, diarrhea, nausea, rectal pain and vomiting.  Endocrine: Negative.  Negative for hot flashes.  Genitourinary: Negative.  Negative for bladder incontinence, difficulty urinating, dyspareunia, dysuria, frequency, hematuria, menstrual problem, nocturia, pelvic pain, vaginal bleeding and vaginal discharge.   Musculoskeletal:  Positive for arthralgias and back pain (lower back pain). Negative for flank pain, gait problem, myalgias, neck pain and neck stiffness.  Skin: Negative.  Negative for itching, rash and wound.  Neurological:  Negative for dizziness, extremity weakness, gait problem, headaches, light-headedness, numbness, seizures and speech difficulty.  Hematological: Negative.  Negative for adenopathy. Does not bruise/bleed easily.  Psychiatric/Behavioral:  Negative for confusion, decreased concentration, depression, sleep disturbance and suicidal ideas. The patient is nervous/anxious.         Stressed, decreased memory     VITALS:  Blood pressure (!) 142/68, pulse 60, temperature 97.6 F (36.4 C), temperature source Oral, resp. rate 18, height 5\' 2"  (1.575 m), weight 181 lb 12.8 oz (82.5 kg), SpO2 99%.  Wt Readings from Last 3 Encounters:  05/29/23 181 lb 12.8 oz (82.5 kg)  01/26/23 179 lb 6.4 oz (81.4 kg)  12/15/22 177 lb 9.6 oz (80.6 kg)    Body mass index is 33.25 kg/m.  Performance status (ECOG): 1 - Symptomatic but completely ambulatory  PHYSICAL EXAM:  Physical Exam Vitals and nursing note reviewed.  Constitutional:      General: She is not in acute distress.    Appearance: Normal appearance. She is normal weight. She is not ill-appearing, toxic-appearing or diaphoretic.  HENT:     Head: Normocephalic and atraumatic.     Right Ear: Tympanic membrane, ear canal and external ear normal. There is no impacted cerumen.     Left Ear: Tympanic membrane, ear canal and external ear normal. There is no impacted cerumen.     Nose: Nose normal. No congestion or rhinorrhea.     Mouth/Throat:     Mouth: Mucous membranes are moist.     Pharynx: Oropharynx is clear. No oropharyngeal exudate or posterior oropharyngeal erythema.  Eyes:     General: No scleral icterus.       Right eye: No discharge.  Left eye: No discharge.     Extraocular Movements: Extraocular movements intact.     Conjunctiva/sclera: Conjunctivae normal.     Pupils: Pupils are equal, round, and reactive to light.  Neck:     Vascular: No carotid bruit.  Cardiovascular:     Rate and Rhythm: Normal rate and regular rhythm.     Pulses: Normal pulses.     Heart sounds: Normal heart sounds. No murmur heard.    No friction rub. No gallop.  Pulmonary:     Effort: Pulmonary effort is normal. No respiratory distress.     Breath sounds: Normal breath sounds. No stridor. No wheezing, rhonchi or rales.  Chest:     Chest wall: No tenderness.  Abdominal:     General: Bowel sounds are normal. There is no  distension.     Palpations: Abdomen is soft. There is no hepatomegaly, splenomegaly or mass.     Tenderness: There is no abdominal tenderness. There is no right CVA tenderness, left CVA tenderness, guarding or rebound.     Hernia: No hernia is present.  Musculoskeletal:        General: No swelling, tenderness, deformity or signs of injury. Normal range of motion.     Cervical back: Normal range of motion and neck supple. No rigidity or tenderness.     Right lower leg: No edema.     Left lower leg: No edema.  Lymphadenopathy:     Cervical: No cervical adenopathy.     Right cervical: No superficial, deep or posterior cervical adenopathy.    Left cervical: No superficial, deep or posterior cervical adenopathy.     Upper Body:     Right upper body: No supraclavicular, axillary or pectoral adenopathy.     Left upper body: No supraclavicular, axillary or pectoral adenopathy.  Skin:    General: Skin is warm and dry.     Coloration: Skin is not jaundiced or pale.     Findings: No bruising, erythema, lesion or rash.  Neurological:     General: No focal deficit present.     Mental Status: She is alert and oriented to person, place, and time. Mental status is at baseline.     Cranial Nerves: No cranial nerve deficit.     Sensory: No sensory deficit.     Motor: No weakness.     Coordination: Coordination normal.     Gait: Gait normal.     Deep Tendon Reflexes: Reflexes normal.  Psychiatric:        Mood and Affect: Mood normal.        Behavior: Behavior normal.        Thought Content: Thought content normal.        Judgment: Judgment normal.     LABS:      Latest Ref Rng & Units 05/29/2023    2:54 PM 04/13/2023   12:00 AM 01/26/2023    4:10 PM  CBC  WBC  17.4     9.0     14.8   Hemoglobin 12.0 - 16.0 13.2     11.8     12.6   Hematocrit 36 - 46 40     36     39.1   Platelets 150 - 400 K/uL 168     158     175      This result is from an external source.   Component Ref Range &  Units 12/15/2022  IgG (Immunoglobin G), Serum 586 - 1,602 mg/dL 1,093  IgA 64 - 422 mg/dL 604  IgM (Immunoglobulin M), Srm 26 - 217 mg/dL 540      Latest Ref Rng & Units 05/29/2023    2:54 PM  CMP  BUN 4 - 21 26      Creatinine 0.5 - 1.1 0.9      Sodium 137 - 147 136      Potassium 3.5 - 5.1 mEq/L 4.6      Chloride 99 - 108 102      CO2 13 - 22 28      Calcium 8.7 - 10.7 9.6      Alkaline Phos 25 - 125 66      AST 13 - 35 24      ALT 7 - 35 U/L 17         This result is from an external source.    Lab Results  Component Value Date   LDH 158 12/15/2022    STUDIES:     HISTORY:   Past Medical History:  Diagnosis Date   Anxiety    Aortic atherosclerosis (HCC)    Bradycardia    Cerebral vascular disease    Degenerative disc disease, lumbar    Depression    Diabetes mellitus without complication (HCC)    Diverticulitis    Essential tremor    Hyperlipidemia    Hypertension    Ischemic colitis (HCC)    Lymphoma (HCC)    she was diagnosed several years ago for about 2.5 years but then labs were completely normal since then. Received no treatment.   Marginal zone lymphoma (HCC)    Nocturnal hypoxia    Osteoarthritis    Retinal detachment    bilateral   Rosacea    Skin cancer    Vitamin D deficiency     Past Surgical History:  Procedure Laterality Date   CATARACT EXTRACTION Bilateral    DILATION AND CURETTAGE OF UTERUS     RETINAL DETACHMENT SURGERY Bilateral    TONSILLECTOMY  1947   TONSILLECTOMY AND ADENOIDECTOMY      Family History  Problem Relation Age of Onset   High blood pressure Mother    Stroke Mother    Parkinson's disease Father    Dementia Maternal Grandmother    Cancer Maternal Grandfather        thinks it was pancreatic   Heart disease Paternal Grandmother    Suicidality Paternal Grandfather     Social History:  reports that she has never smoked. She has never used smokeless tobacco. She reports that she does not currently use  alcohol. She reports that she does not use drugs. The patient is alone today.  She is widowed.  She has 2 sons.  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  Allergies:  Allergies  Allergen Reactions   Bactrim Ds [Sulfamethoxazole-Trimethoprim]     Irritability & lip swelling   Lisinopril     URI symptoms   Losartan Potassium     Fingers felt numb   Metoprolol     Made her tired   Moxifloxacin Other (See Comments)    Eye irritation  Eye irritation  Eye irritation   Propranolol     Hair loss   Ranitidine Hcl     Hair loss    Current Medications: Current Outpatient Medications  Medication Sig Dispense Refill   aspirin 325 MG tablet Take 325 mg by mouth daily. Patient reports taking twice daily     Ergocalciferol (  VITAMIN D2 PO) Take 2,000 Units by mouth daily.      FIBER PO Take 2 each by mouth. Fiber well gummies 5 gram each     Omega-3 Fatty Acids (OMEGA-3 EPA FISH OIL PO) Take by mouth.     TURMERIC CURCUMIN PO Take by mouth.     Turmeric, Curcuma Longa, (CURCUMIN) POWD by miscellaneous route.     No current facility-administered medications for this visit.     ASSESSMENT & PLAN:  Assessment:  Low-grade B-cell Lymphoproliferative disorder This likely represents marginal zone lymphoma diagnosed in 2016 and has a very indolent course. She now has increasing lymphocytosis but normal hemoglobin and platelet counts. I have reassured her that this is something she can live with and will not likely need treatment.   Plan: She will see Dr. Sudie Bailey in December. Her WBC was 9.0, hemoglobin was low at 11.8, and platelet count at 158,000 as of 04/13/2023. If her anemia is any worse I will add evaluation with B-12, folate, iron studies. Her labs today are pending, I will send a copy of her labs to Dr. Sudie Bailey. She is moving to [place] but plans to continue her oncology follow up here. I will see her back in 4 months with CBC and CMP. I discussed the  assessment and treatment plan with the patient.  The patient was provided an opportunity to ask questions and all were answered.  The patient agreed with the plan and demonstrated an understanding of the instructions.   I provided 14  minutes of face-to-face time during this encounter and > 50% was spent counseling as documented under my assessment and plan.    Gery Pray MD [facility] [place] [phone]   Junie Spencer as a scribe for Dellia Beckwith, MD.,have documented all relevant documentation on the behalf of Dellia Beckwith, MD,as directed by  Dellia Beckwith, MD while in the presence of Dellia Beckwith, MD.

## 2023-05-30 ENCOUNTER — Telehealth: Payer: Self-pay | Admitting: Oncology

## 2023-05-30 ENCOUNTER — Telehealth: Payer: Self-pay

## 2023-05-30 NOTE — Telephone Encounter (Signed)
Pt req lab results form yesterday.

## 2023-05-30 NOTE — Telephone Encounter (Signed)
Pt would like to schedule her next appt once she has been notified of lab results.  Will forward msg to nurse.   Scheduling Message Entered by Gery Pray H on 05/29/2023 at  4:19 PM Priority: Routine <No visit type provided>  Department: [facility]  Provider:  Scheduling Notes:  RT 4 months with labs (2/11?)

## 2023-05-31 NOTE — Telephone Encounter (Signed)
Patient notified of message

## 2023-06-19 DIAGNOSIS — D485 Neoplasm of uncertain behavior of skin: Secondary | ICD-10-CM | POA: Diagnosis not present

## 2023-06-19 DIAGNOSIS — L821 Other seborrheic keratosis: Secondary | ICD-10-CM | POA: Diagnosis not present

## 2023-08-02 DIAGNOSIS — Z7189 Other specified counseling: Secondary | ICD-10-CM | POA: Diagnosis not present

## 2023-08-02 DIAGNOSIS — Z1331 Encounter for screening for depression: Secondary | ICD-10-CM | POA: Diagnosis not present

## 2023-08-02 DIAGNOSIS — D72829 Elevated white blood cell count, unspecified: Secondary | ICD-10-CM | POA: Diagnosis not present

## 2023-08-02 DIAGNOSIS — R0981 Nasal congestion: Secondary | ICD-10-CM | POA: Diagnosis not present

## 2023-08-02 DIAGNOSIS — Z6834 Body mass index (BMI) 34.0-34.9, adult: Secondary | ICD-10-CM | POA: Diagnosis not present

## 2023-08-02 DIAGNOSIS — Z1231 Encounter for screening mammogram for malignant neoplasm of breast: Secondary | ICD-10-CM | POA: Diagnosis not present

## 2023-08-02 DIAGNOSIS — E785 Hyperlipidemia, unspecified: Secondary | ICD-10-CM | POA: Diagnosis not present

## 2023-08-02 DIAGNOSIS — Z0000 Encounter for general adult medical examination without abnormal findings: Secondary | ICD-10-CM | POA: Diagnosis not present

## 2023-08-02 DIAGNOSIS — R7301 Impaired fasting glucose: Secondary | ICD-10-CM | POA: Diagnosis not present

## 2023-08-02 LAB — CBC AND DIFFERENTIAL
HCT: 39 (ref 36–46)
Hemoglobin: 12.7 (ref 12.0–16.0)
Neutrophils Absolute: 3.4
Platelets: 196 10*3/uL (ref 150–400)
WBC: 12.1

## 2023-08-02 LAB — CBC: RBC: 4.18 (ref 3.87–5.11)

## 2023-08-10 DIAGNOSIS — Z1231 Encounter for screening mammogram for malignant neoplasm of breast: Secondary | ICD-10-CM | POA: Diagnosis not present

## 2023-08-10 LAB — HM MAMMOGRAPHY

## 2023-09-05 DIAGNOSIS — Z1231 Encounter for screening mammogram for malignant neoplasm of breast: Secondary | ICD-10-CM | POA: Diagnosis not present

## 2023-09-06 DIAGNOSIS — C44311 Basal cell carcinoma of skin of nose: Secondary | ICD-10-CM | POA: Diagnosis not present

## 2023-09-17 ENCOUNTER — Telehealth: Payer: Self-pay | Admitting: Oncology

## 2023-09-17 NOTE — Telephone Encounter (Signed)
Patient has been scheduled. Aware of appt date and time.     Postpone Notification  This message was postponed by Otilio Miu and returned on Monday September 17, 2023. Scheduling Message Entered by Gery Pray H on 05/29/2023 at  4:19 PM Priority: Routine <No visit type provided>  Department: [facility]  Provider:  Scheduling Notes:  RT 4 months with labs (2/11?)

## 2023-10-11 ENCOUNTER — Inpatient Hospital Stay: Payer: Medicare PPO

## 2023-10-11 ENCOUNTER — Inpatient Hospital Stay: Payer: Medicare PPO | Admitting: Oncology

## 2023-10-15 ENCOUNTER — Telehealth: Payer: Self-pay | Admitting: Nurse Practitioner

## 2023-10-15 ENCOUNTER — Inpatient Hospital Stay: Payer: Medicare PPO | Attending: Nurse Practitioner

## 2023-10-15 ENCOUNTER — Encounter: Payer: Self-pay | Admitting: Nurse Practitioner

## 2023-10-15 ENCOUNTER — Inpatient Hospital Stay: Payer: Medicare PPO | Admitting: Nurse Practitioner

## 2023-10-15 VITALS — BP 183/70 | HR 77 | Temp 97.8°F | Resp 18 | Ht 62.0 in | Wt 188.2 lb

## 2023-10-15 DIAGNOSIS — D72820 Lymphocytosis (symptomatic): Secondary | ICD-10-CM | POA: Insufficient documentation

## 2023-10-15 DIAGNOSIS — C8510 Unspecified B-cell lymphoma, unspecified site: Secondary | ICD-10-CM | POA: Diagnosis not present

## 2023-10-15 DIAGNOSIS — C884 Extranodal marginal zone b-cell lymphoma of mucosa-associated lymphoid tissue (malt-lymphoma) not having achieved remission: Secondary | ICD-10-CM

## 2023-10-15 LAB — CBC WITH DIFFERENTIAL (CANCER CENTER ONLY)
Abs Immature Granulocytes: 0 10*3/uL (ref 0.00–0.07)
Basophils Absolute: 0 10*3/uL (ref 0.0–0.1)
Basophils Relative: 0 %
Eosinophils Absolute: 0.2 10*3/uL (ref 0.0–0.5)
Eosinophils Relative: 2 %
HCT: 38.5 % (ref 36.0–46.0)
Hemoglobin: 12.7 g/dL (ref 12.0–15.0)
Immature Granulocytes: 0 %
Lymphocytes Relative: 65 %
Lymphs Abs: 7.3 10*3/uL — ABNORMAL HIGH (ref 0.7–4.0)
MCH: 30.4 pg (ref 26.0–34.0)
MCHC: 33 g/dL (ref 30.0–36.0)
MCV: 92.1 fL (ref 80.0–100.0)
Monocytes Absolute: 1.1 10*3/uL — ABNORMAL HIGH (ref 0.1–1.0)
Monocytes Relative: 10 %
Neutro Abs: 2.6 10*3/uL (ref 1.7–7.7)
Neutrophils Relative %: 23 %
Platelet Count: 158 10*3/uL (ref 150–400)
RBC: 4.18 MIL/uL (ref 3.87–5.11)
RDW: 13.3 % (ref 11.5–15.5)
Smear Review: NORMAL
WBC Count: 11.2 10*3/uL — ABNORMAL HIGH (ref 4.0–10.5)
WBC Morphology: ABNORMAL
nRBC: 0 % (ref 0.0–0.2)
nRBC: 0 /100{WBCs}

## 2023-10-15 LAB — CMP (CANCER CENTER ONLY)
ALT: 7 U/L (ref 0–44)
AST: 14 U/L — ABNORMAL LOW (ref 15–41)
Albumin: 4.4 g/dL (ref 3.5–5.0)
Alkaline Phosphatase: 62 U/L (ref 38–126)
Anion gap: 11 (ref 5–15)
BUN: 20 mg/dL (ref 8–23)
CO2: 23 mmol/L (ref 22–32)
Calcium: 9.7 mg/dL (ref 8.9–10.3)
Chloride: 106 mmol/L (ref 98–111)
Creatinine: 1.01 mg/dL — ABNORMAL HIGH (ref 0.44–1.00)
GFR, Estimated: 55 mL/min — ABNORMAL LOW (ref >60)
Glucose, Bld: 117 mg/dL — ABNORMAL HIGH (ref 70–99)
Potassium: 4.5 mmol/L (ref 3.5–5.1)
Sodium: 140 mmol/L (ref 135–145)
Total Bilirubin: 0.3 mg/dL (ref 0.0–1.2)
Total Protein: 7.4 g/dL (ref 6.5–8.1)

## 2023-10-15 NOTE — Progress Notes (Signed)
 [facility] [address] [place], [place] [address] [phone] (fax)  Clinic Day: 10/15/23  Referring physician: Philemon Kingdom, MD   REASON FOR CONSULTATION:  CC: Lymphocytosis  Current Treatment: Surveillance   HISTORY OF PRESENT ILLNESS:  Kerri Garza is a 83 y.o. 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 She saw originally Dr. Melvyn Neth in April 2016 for leukocytosis, which was predominantly lymphocytes, dating back to December 2015. Her white count ranged from 11,900 to 19,800.  Flow cytometry of the peripheral blood revealed a monoclonal B-cell population consistent with marginal zone lymphoma, involving 54% of the cells.  This was positive for CD 19, CD 20, CD 21, CD 22, HLA DR, CD 11, CD 25, FLT 7, and lambda light chains and negative for CD 103.  Regular follow-up was recommended, as she did not have significant lymphocytosis, or any associated B symptoms, splenomegaly, or adenopathy.  She did not follow-up and was referred back and July 2017, at which time she saw Dr. Gilman Buttner.  Her white count was 19,200 with 34% neutrophils and 62% lymphocytes.  Serum protein electrophoresis was normal.  Quantitative immunoglobulins were normal.  She remained stable, so routine follow-up was recommend. She was seen again in July 2018 her white count was 19,500 with 25% neutrophils and 75% lymphocytes..  Dr. Gilman Buttner recommended annual follow-up at that time.  She was last seen in August 2019, at which time her white count had normalized at 6600 with 65% neutrophils, 19% lymphocytes, 10% monocytes and 4% eosinophils.  As that was the case, we recommended follow-up with Dr. Sudie Bailey with referral back as needed.  She has never had associated anemia, thrombocytopenia or splenomegaly.  She is referred back by Dr. Philemon Kingdom. On April 9, her white count was 12,300 with 24% neutrophils and 67% lymphocytes, hemoglobin 13, MCV 94 platelets 173,000.  Review of her medical record revealed a white count of 10,000 with 28% neutrophils and 61% lymphocytes in June 2023.  In February 2023, her white count was 8.9 with 31% neutrophils and 56% lymphocytes. Her white count was 7.8 in the ER in January 2020 with 75% neutrophils and 14% lymphocytes.  A CT abdomen and pelvis at that time, did not reveal any acute findings, lymphadenopathy or hepatosplenomegaly.  She had an EGD and colonoscopy with Dr. 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  December 2023- reported several infections including shingles and flu as well as sinus infection. Has chronic fatigue, back and hip pain. Denied B symptoms. No adenopathy, early satiety, or abdominal pain.   INTERVAL HISTORY:  Kerri Garza is 83 year old female who returns to clinic for follow up and continued surveillance of her likely marginal zone lymphoma since 2016. She continues to feel well and denies interval infections. No fevers, chills, night sweats. No new lumps or bumps. Reportedly her other cancer screenings are up to date. She denies new pain. No nausea, vomiting, chest pain, shortness of breath, or cough.   REVIEW OF SYSTEMS:  Review of Systems  Constitutional:  Positive for fatigue. Negative for appetite change, chills, diaphoresis, fever and unexpected weight change.  HENT:  Negative.  Negative for hearing loss, lump/mass, mouth sores, nosebleeds, sore throat, tinnitus, trouble swallowing and voice change.   Eyes: Negative.  Negative for eye  problems and icterus.  Respiratory: Negative.  Negative for chest tightness, cough, hemoptysis, shortness of breath and wheezing.   Cardiovascular: Negative.  Negative for chest pain, leg swelling and palpitations.   Gastrointestinal: Negative.  Negative for abdominal distention, abdominal pain, blood in stool, constipation, diarrhea, nausea, rectal pain and vomiting.  Endocrine: Negative.  Negative for hot flashes.  Genitourinary: Negative.  Negative for bladder incontinence, difficulty urinating, dyspareunia, dysuria, frequency, hematuria, menstrual problem, nocturia, pelvic pain, vaginal bleeding and vaginal discharge.   Musculoskeletal:  Positive for arthralgias and back pain (lower back pain). Negative for flank pain, gait problem, myalgias, neck pain and neck stiffness.  Skin: Negative.  Negative for itching, rash and wound.  Neurological:  Negative for dizziness, extremity weakness, gait problem, headaches, light-headedness, numbness, seizures and speech difficulty.  Hematological: Negative.  Negative for adenopathy. Does not bruise/bleed easily.  Psychiatric/Behavioral:  Negative for confusion, decreased concentration, depression, sleep disturbance and suicidal ideas. The patient is nervous/anxious.      VITALS:  There were no vitals taken for this visit.  Wt Readings from Last 3 Encounters:  05/29/23 181 lb 12.8 oz (82.5 kg)  01/26/23 179 lb 6.4 oz (81.4 kg)  12/15/22 177 lb 9.6 oz (80.6 kg)    There is no height or weight on file to calculate BMI.  Performance status (ECOG): 1 - Symptomatic but completely ambulatory  PHYSICAL EXAM:  Physical Exam Vitals reviewed.  Constitutional:      Appearance: She is obese. She is not ill-appearing.  Pulmonary:     Effort: Pulmonary effort is normal.  Abdominal:     General: There is no distension.  Lymphadenopathy:     Cervical: No cervical adenopathy.  Skin:    Coloration: Skin is not pale.     Findings: No bruising.  Neurological:     Mental Status: She is alert and oriented to person, place, and time.  Psychiatric:        Attention and Perception: Attention normal.        Mood and Affect: Mood is anxious.        Speech: Speech normal.      LABS:      Latest Ref Rng & Units 10/15/2023   12:39 PM 08/02/2023   12:00 AM 05/29/2023    2:54 PM  CBC  WBC 4.0 - 10.5 K/uL 11.2  12.1     17.4      Hemoglobin 12.0 - 15.0 g/dL 16.1  09.6     04.5      Hematocrit 36.0 - 46.0 % 38.5  39     40      Platelets 150 - 400 K/uL 158  196     168         This result is from an external source.   Component Ref Range & Units 12/15/2022  IgG (Immunoglobin G), Serum 586 - 1,602 mg/dL 4,098  IgA 64 - 119 mg/dL 147  IgM (Immunoglobulin M), Srm 26 - 217 mg/dL 829      Latest Ref Rng & Units 10/15/2023   12:39 PM 05/29/2023    2:54 PM  CMP  Glucose 70 - 99 mg/dL 562    BUN 8 - 23 mg/dL 20  26      Creatinine 0.44 - 1.00 mg/dL 1.30  0.9      Sodium 865 - 145 mmol/L 140  136      Potassium 3.5 - 5.1 mmol/L 4.5  4.6      Chloride  98 - 111 mmol/L 106  102      CO2 22 - 32 mmol/L 23  28      Calcium 8.9 - 10.3 mg/dL 9.7  9.6      Total Protein 6.5 - 8.1 g/dL 7.4    Total Bilirubin 0.0 - 1.2 mg/dL 0.3    Alkaline Phos 38 - 126 U/L 62  66      AST 15 - 41 U/L 14  24      ALT 0 - 44 U/L 7  17         This result is from an external source.    STUDIES:   No imaging on site today.  HISTORY:   Past Medical History:  Diagnosis Date   Anxiety    Aortic atherosclerosis (HCC)    Bradycardia    Cerebral vascular disease    Degenerative disc disease, lumbar    Depression    Diabetes mellitus without complication (HCC)    Diverticulitis    Essential tremor    Hyperlipidemia    Hypertension    Ischemic colitis (HCC)    Lymphoma (HCC)    she was diagnosed several years ago for about 2.5 years but then labs were completely normal since then. Received no treatment.   Marginal zone lymphoma (HCC)    Nocturnal hypoxia    Osteoarthritis    Retinal detachment    bilateral   Rosacea    Skin cancer    Vitamin D deficiency     Past Surgical History:  Procedure Laterality Date   CATARACT EXTRACTION Bilateral    DILATION AND  CURETTAGE OF UTERUS     RETINAL DETACHMENT SURGERY Bilateral    TONSILLECTOMY  1947   TONSILLECTOMY AND ADENOIDECTOMY      Family History  Problem Relation Age of Onset   High blood pressure Mother    Stroke Mother    Parkinson's disease Father    Dementia Maternal Grandmother    Cancer Maternal Grandfather        thinks it was pancreatic   Heart disease Paternal Grandmother    Suicidality Paternal Grandfather     Social History:  reports that she has never smoked. She has never used smokeless tobacco. She reports that she does not currently use alcohol. She reports that she does not use drugs. The patient is alone today.  She is widowed.  She has 2 sons.  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  Allergies:  Allergies  Allergen Reactions   Bactrim Ds [Sulfamethoxazole-Trimethoprim]     Irritability & lip swelling   Lisinopril     URI symptoms   Losartan Potassium     Fingers felt numb   Metoprolol     Made her tired   Moxifloxacin Other (See Comments)    Eye irritation  Eye irritation  Eye irritation   Propranolol     Hair loss   Ranitidine Hcl     Hair loss    Current Medications: Current Outpatient Medications  Medication Sig Dispense Refill   aspirin 325 MG tablet Take 325 mg by mouth daily. Patient reports taking twice daily     Ergocalciferol (VITAMIN D2 PO) Take 2,000 Units by mouth daily.      FIBER PO Take 2 each by mouth. Fiber well gummies 5 gram each     Omega-3 Fatty Acids (OMEGA-3 EPA FISH OIL PO) Take by mouth.     TURMERIC CURCUMIN PO Take  by mouth.     Turmeric, Curcuma Longa, (CURCUMIN) POWD by miscellaneous route.     No current facility-administered medications for this visit.     ASSESSMENT & PLAN:  Assessment:  Low-grade B-cell Lymphoproliferative disorder This likely represents marginal zone lymphoma diagnosed in 2016 and has a very indolent course. Lymphocytosis stable and only slightly elevated from  normal range. Normal hemoglobin and platelet counts. Hold off on imaging or additional workup. Clinically asymptomatic. She prefers to continue to have blood work performed every 2 months. Given very indolent course we could consider every 6 to 12 months but ok to keep as is for now.    Plan: 4 mo- lab (cbc cmp), see APP- la   I discussed the assessment and treatment plan with the patient.  The patient was provided an opportunity to ask questions and all were answered.  The patient agreed with the plan and demonstrated an understanding of the instructions.   Consuello Masse, DNP, AGNP-C, AOCNP [facility] [phone]

## 2023-10-15 NOTE — Telephone Encounter (Signed)
 10/15/23 LVM next appt scheduled on 02/12/24 arrive at 130pm

## 2023-11-15 DIAGNOSIS — M2041 Other hammer toe(s) (acquired), right foot: Secondary | ICD-10-CM | POA: Diagnosis not present

## 2023-11-15 DIAGNOSIS — M2042 Other hammer toe(s) (acquired), left foot: Secondary | ICD-10-CM | POA: Diagnosis not present

## 2023-11-15 DIAGNOSIS — M79675 Pain in left toe(s): Secondary | ICD-10-CM | POA: Diagnosis not present

## 2023-11-15 DIAGNOSIS — B351 Tinea unguium: Secondary | ICD-10-CM | POA: Diagnosis not present

## 2023-11-15 DIAGNOSIS — L600 Ingrowing nail: Secondary | ICD-10-CM | POA: Diagnosis not present

## 2023-11-15 DIAGNOSIS — M79674 Pain in right toe(s): Secondary | ICD-10-CM | POA: Diagnosis not present

## 2023-11-15 DIAGNOSIS — L03115 Cellulitis of right lower limb: Secondary | ICD-10-CM | POA: Diagnosis not present

## 2023-11-26 DIAGNOSIS — I10 Essential (primary) hypertension: Secondary | ICD-10-CM | POA: Diagnosis not present

## 2023-11-26 DIAGNOSIS — M7989 Other specified soft tissue disorders: Secondary | ICD-10-CM | POA: Diagnosis not present

## 2023-11-26 DIAGNOSIS — R0600 Dyspnea, unspecified: Secondary | ICD-10-CM | POA: Diagnosis not present

## 2023-12-12 DIAGNOSIS — Z79899 Other long term (current) drug therapy: Secondary | ICD-10-CM | POA: Diagnosis not present

## 2023-12-12 DIAGNOSIS — E785 Hyperlipidemia, unspecified: Secondary | ICD-10-CM | POA: Diagnosis not present

## 2023-12-12 DIAGNOSIS — R7301 Impaired fasting glucose: Secondary | ICD-10-CM | POA: Diagnosis not present

## 2023-12-12 DIAGNOSIS — D72829 Elevated white blood cell count, unspecified: Secondary | ICD-10-CM | POA: Diagnosis not present

## 2023-12-13 DIAGNOSIS — M549 Dorsalgia, unspecified: Secondary | ICD-10-CM | POA: Diagnosis not present

## 2023-12-13 DIAGNOSIS — Z6834 Body mass index (BMI) 34.0-34.9, adult: Secondary | ICD-10-CM | POA: Diagnosis not present

## 2023-12-13 DIAGNOSIS — R0982 Postnasal drip: Secondary | ICD-10-CM | POA: Diagnosis not present

## 2023-12-13 DIAGNOSIS — E1169 Type 2 diabetes mellitus with other specified complication: Secondary | ICD-10-CM | POA: Diagnosis not present

## 2024-02-12 ENCOUNTER — Inpatient Hospital Stay: Payer: Medicare PPO

## 2024-02-12 ENCOUNTER — Inpatient Hospital Stay: Payer: Medicare PPO | Attending: Hematology and Oncology | Admitting: Hematology and Oncology

## 2024-02-12 ENCOUNTER — Encounter: Payer: Self-pay | Admitting: Hematology and Oncology

## 2024-02-12 VITALS — BP 177/70 | HR 62 | Temp 98.1°F | Resp 20 | Ht 62.0 in | Wt 183.7 lb

## 2024-02-12 DIAGNOSIS — C8300 Small cell B-cell lymphoma, unspecified site: Secondary | ICD-10-CM | POA: Insufficient documentation

## 2024-02-12 DIAGNOSIS — C884 Extranodal marginal zone b-cell lymphoma of mucosa-associated lymphoid tissue (malt-lymphoma) not having achieved remission: Secondary | ICD-10-CM

## 2024-02-12 LAB — CBC WITH DIFFERENTIAL (CANCER CENTER ONLY)
Abs Immature Granulocytes: 0.5 10*3/uL — ABNORMAL HIGH (ref 0.00–0.07)
Basophils Absolute: 0 10*3/uL (ref 0.0–0.1)
Basophils Relative: 0 %
Eosinophils Absolute: 0.1 10*3/uL (ref 0.0–0.5)
Eosinophils Relative: 1 %
HCT: 36 % (ref 36.0–46.0)
Hemoglobin: 11.7 g/dL — ABNORMAL LOW (ref 12.0–15.0)
Immature Granulocytes: 4 %
Lymphocytes Relative: 63 %
Lymphs Abs: 7.9 10*3/uL — ABNORMAL HIGH (ref 0.7–4.0)
MCH: 30.1 pg (ref 26.0–34.0)
MCHC: 32.5 g/dL (ref 30.0–36.0)
MCV: 92.5 fL (ref 80.0–100.0)
Monocytes Absolute: 1.1 10*3/uL — ABNORMAL HIGH (ref 0.1–1.0)
Monocytes Relative: 9 %
Neutro Abs: 2.9 10*3/uL (ref 1.7–7.7)
Neutrophils Relative %: 23 %
Platelet Count: 139 10*3/uL — ABNORMAL LOW (ref 150–400)
RBC: 3.89 MIL/uL (ref 3.87–5.11)
RDW: 13.8 % (ref 11.5–15.5)
Smear Review: NORMAL
WBC Count: 12.5 10*3/uL — ABNORMAL HIGH (ref 4.0–10.5)
WBC Morphology: ABNORMAL
nRBC: 0 % (ref 0.0–0.2)

## 2024-02-12 LAB — CMP (CANCER CENTER ONLY)
ALT: 8 U/L (ref 0–44)
AST: 13 U/L — ABNORMAL LOW (ref 15–41)
Albumin: 4.3 g/dL (ref 3.5–5.0)
Alkaline Phosphatase: 57 U/L (ref 38–126)
Anion gap: 11 (ref 5–15)
BUN: 26 mg/dL — ABNORMAL HIGH (ref 8–23)
CO2: 22 mmol/L (ref 22–32)
Calcium: 9.3 mg/dL (ref 8.9–10.3)
Chloride: 102 mmol/L (ref 98–111)
Creatinine: 1.01 mg/dL — ABNORMAL HIGH (ref 0.44–1.00)
GFR, Estimated: 55 mL/min — ABNORMAL LOW (ref >60)
Glucose, Bld: 90 mg/dL (ref 70–99)
Potassium: 5 mmol/L (ref 3.5–5.1)
Sodium: 135 mmol/L (ref 135–145)
Total Bilirubin: 0.3 mg/dL (ref 0.0–1.2)
Total Protein: 6.8 g/dL (ref 6.5–8.1)

## 2024-02-12 NOTE — Progress Notes (Signed)
 [facility]  [address] [place],  [place]  [address] [phone]  Clinic Day:  02/12/2024  Referring physician: Jefferey Fitch, MD   CHIEF COMPLAINT:  CC: Low grade marginal zone B-cell lymphoma  Current Treatment: Observation  HISTORY OF PRESENT ILLNESS:  Kerri Garza is a 83 y.o. 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 She originally saw Dr. Ezzard in April 2016 for leukocytosis, which was predominantly lymphocytes, dating back to December 2015. Her white count ranged from 11,900 to 19,800.  Flow cytometry of the peripheral blood revealed a monoclonal B-cell population consistent with marginal zone lymphoma, involving 54% of the cells.  This was positive for CD 19, CD 20, CD 21, CD 22, HLA DR, CD 11, CD 25, FLT 7, and lambda light chains and negative for CD 103.  Regular follow-up was not recommended, as she did not have significant lymphocytosis, or any associated B symptoms, splenomegaly, or adenopathy.  She did not follow-up and was referred back and July 2017, at which time she saw Dr. Cornelius.  Her white count was 19,200 with 34% neutrophils and 62% lymphocytes.  Serum protein electrophoresis was normal.  Quantitative immunoglobulins were normal.  She remained stable, so routine follow-up was recommend. She was seen again in July 2018, her white count was 19,500 with 25% neutrophils and 75% lymphocytes.  Dr. Cornelius recommended annual follow-up at that time.  She was seen in August 2019, at which time her white count had normalized at 6600 with 65% neutrophils, 19% lymphocytes, 10% monocytes and 4% eosinophils.  As that was the case, we recommended follow-up with Dr. Jefferey with referral back as needed.  She has never had associated anemia, thrombocytopenia or splenomegaly.  EGD and colonoscopy  with Dr. 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  CT abdomen and pelvis in January 2020 did not reveal any lymphadenopathy or splenomegaly.  She was referred back by Dr. Fitch Jefferey in April 2024, as her lymphocytosis had worsened. We recommended labs every 2 months and follow up every 4 months.  In our office in February, WBCs were 11.2 was 23% neutrophils, 65% lymphocytes, 10% monocytes and 2% eosinophils.  Hemoglobin 12.7 with an MCV of 92.  Platelets 158,000.  At Dr. Ardis office in April WBCs were 12.3, 25% neutrophils, 62% lymphocytes, 11% monocytes and 3% eosinophils.  Hemoglobin 12.3.  Platelets 170,000.  INTERVAL HISTORY:  Kerri Garza is here today for repeat clinical assessment.  She states she is feeling a little off today, but she is fasting.  She also does not like the heat.  She denies any specific symptoms.  She denies fatigue.  She denies fevers, chills or night sweats. She denies pain. Her appetite is good. Her weight has decreased 5 pounds over last 4 months.  REVIEW OF SYSTEMS:  Review of Systems  Constitutional:  Negative for appetite change, chills, diaphoresis, fatigue, fever and unexpected weight change.  HENT:   Negative for lump/mass, mouth sores, nosebleeds and sore throat.   Respiratory:  Negative for cough, hemoptysis and shortness of breath.   Cardiovascular:  Negative for chest pain and leg swelling.  Gastrointestinal:  Negative for abdominal pain, blood in stool, constipation, diarrhea, nausea and vomiting.  Endocrine: Negative for hot flashes.  Genitourinary:  Negative for  difficulty urinating, dysuria, frequency, hematuria and vaginal bleeding.   Musculoskeletal:  Negative for arthralgias, back pain, gait problem and myalgias.  Skin:  Negative for rash.  Neurological:  Positive for numbness (neuropathy of hands). Negative for dizziness,  extremity weakness, gait problem, headaches and light-headedness.  Hematological:  Negative for adenopathy. Does not bruise/bleed easily.  Psychiatric/Behavioral:  Negative for depression and sleep disturbance. The patient is not nervous/anxious.      VITALS:  Blood pressure (!) 177/70, pulse 62, temperature 98.1 F (36.7 C), temperature source Oral, resp. rate 20, height 5' 2 (1.575 m), weight 183 lb 11.2 oz (83.3 kg), SpO2 95%.  Wt Readings from Last 3 Encounters:  02/12/24 183 lb 11.2 oz (83.3 kg)  10/15/23 188 lb 3.2 oz (85.4 kg)  05/29/23 181 lb 12.8 oz (82.5 kg)    Body mass index is 33.6 kg/m.  Performance status (ECOG): 1 - Symptomatic but completely ambulatory  PHYSICAL EXAM:  Physical Exam Vitals and nursing note reviewed.  Constitutional:      General: She is not in acute distress.    Appearance: Normal appearance.  HENT:     Head: Normocephalic and atraumatic.     Mouth/Throat:     Mouth: Mucous membranes are moist.     Pharynx: Oropharynx is clear. No oropharyngeal exudate or posterior oropharyngeal erythema.   Eyes:     General: No scleral icterus.    Extraocular Movements: Extraocular movements intact.     Conjunctiva/sclera: Conjunctivae normal.     Pupils: Pupils are equal, round, and reactive to light.    Cardiovascular:     Rate and Rhythm: Normal rate and regular rhythm.     Heart sounds: Normal heart sounds. No murmur heard.    No friction rub. No gallop.  Pulmonary:     Effort: Pulmonary effort is normal.     Breath sounds: Normal breath sounds. No wheezing, rhonchi or rales.  Abdominal:     General: There is no distension.     Palpations: Abdomen is soft. There is no hepatomegaly, splenomegaly or mass.     Tenderness: There is no abdominal tenderness.   Musculoskeletal:        General: Normal range of motion.     Cervical back: Normal range of motion and neck supple. No tenderness.     Right lower leg: No edema.     Left lower leg: No  edema.  Lymphadenopathy:     Cervical: No cervical adenopathy.     Upper Body:     Right upper body: No supraclavicular or axillary adenopathy.     Left upper body: No supraclavicular or axillary adenopathy.     Lower Body: No right inguinal adenopathy. No left inguinal adenopathy.   Skin:    General: Skin is warm and dry.     Coloration: Skin is not jaundiced.     Findings: No rash.   Neurological:     Mental Status: She is alert and oriented to person, place, and time.     Cranial Nerves: No cranial nerve deficit.   Psychiatric:        Mood and Affect: Mood normal.        Behavior: Behavior normal.        Thought Content: Thought content normal.     LABS:      Latest Ref Rng & Units 02/12/2024    1:43 PM 10/15/2023   12:39 PM 08/02/2023   12:00 AM  CBC  WBC 4.0 - 10.5  K/uL 12.5  11.2  12.1      Hemoglobin 12.0 - 15.0 g/dL 88.2  87.2  87.2      Hematocrit 36.0 - 46.0 % 36.0  38.5  39      Platelets 150 - 400 K/uL 139  158  196         This result is from an external source.    Latest Reference Range & Units 02/12/24 13:43  Neutrophils % 23  Lymphocytes % 63  Monocytes Relative % 9  Eosinophil % 1  Basophil % 0  Immature Granulocytes % 4  NEUT# 1.7 - 7.7 K/uL 2.9  Lymphs Abs 0.7 - 4.0 K/uL 7.9 (H)  Monocyte # 0.1 - 1.0 K/uL 1.1 (H)  Eosinophils Absolute 0.0 - 0.5 K/uL 0.1  Basophils Absolute 0.0 - 0.1 K/uL 0.0  Abs Immature Granulocytes 0.00 - 0.07 K/uL 0.50 (H)  RBC Morphology  MORPHOLOGY UNREMARKABLE  WBC Morphology  Abnormal lymphocytes present  Smear Review  Normal platelet morphology  (H): Data is abnormally high    Latest Ref Rng & Units 02/12/2024    1:43 PM 10/15/2023   12:39 PM 05/29/2023    2:54 PM  CMP  Glucose 70 - 99 mg/dL 90  882    BUN 8 - 23 mg/dL 26  20  26       Creatinine 0.44 - 1.00 mg/dL 8.98  8.98  0.9      Sodium 135 - 145 mmol/L 135  140  136      Potassium 3.5 - 5.1 mmol/L 5.0  4.5  4.6      Chloride 98 - 111 mmol/L 102  106  102       CO2 22 - 32 mmol/L 22  23  28       Calcium 8.9 - 10.3 mg/dL 9.3  9.7  9.6      Total Protein 6.5 - 8.1 g/dL 6.8  7.4    Total Bilirubin 0.0 - 1.2 mg/dL 0.3  0.3    Alkaline Phos 38 - 126 U/L 57  62  66      AST 15 - 41 U/L 13  14  24       ALT 0 - 44 U/L 8  7  17          This result is from an external source.    Lab Results  Component Value Date   LDH 158 12/15/2022    STUDIES:  No results found.    HISTORY:   Past Medical History:  Diagnosis Date   Anxiety    Aortic atherosclerosis (HCC)    Bradycardia    Cerebral vascular disease    Degenerative disc disease, lumbar    Depression    Diabetes mellitus without complication (HCC)    Diverticulitis    Essential tremor    Hyperlipidemia    Hypertension    Ischemic colitis (HCC)    Lymphoma (HCC)    she was diagnosed several years ago for about 2.5 years but then labs were completely normal since then. Received no treatment.   Marginal zone lymphoma (HCC)    Nocturnal hypoxia    Osteoarthritis    Retinal detachment    bilateral   Rosacea    Skin cancer    Vitamin D deficiency     Past Surgical History:  Procedure Laterality Date   CATARACT EXTRACTION Bilateral    DILATION AND CURETTAGE OF UTERUS     RETINAL DETACHMENT SURGERY Bilateral  TONSILLECTOMY  1947   TONSILLECTOMY AND ADENOIDECTOMY      Family History  Problem Relation Age of Onset   High blood pressure Mother    Stroke Mother    Parkinson's disease Father    Dementia Maternal Grandmother    Cancer Maternal Grandfather        thinks it was pancreatic   Heart disease Paternal Grandmother    Suicidality Paternal Grandfather     Social History:  reports that she has never smoked. She has never used smokeless tobacco. She reports that she does not currently use alcohol . She reports that she does not use drugs.The patient is alone today.  Allergies:  Allergies  Allergen Reactions   Bactrim Ds [Sulfamethoxazole-Trimethoprim]      Irritability & lip swelling   Lisinopril     URI symptoms   Losartan Potassium     Fingers felt numb   Metoprolol     Made her tired   Moxifloxacin Other (See Comments)    Eye irritation  Eye irritation  Eye irritation   Propranolol     Hair loss   Ranitidine Hcl     Hair loss    Current Medications: Current Outpatient Medications  Medication Sig Dispense Refill   acetaminophen (TYLENOL 8 HOUR ARTHRITIS PAIN) 650 MG CR tablet Take 650 mg by mouth every 8 (eight) hours as needed for pain.     acidophilus (RISAQUAD) CAPS capsule Take 1 capsule by mouth every evening.     nystatin (MYCOSTATIN/NYSTOP) powder Apply 1 Application topically 2 (two) times daily.     aspirin 325 MG tablet Take 325 mg by mouth daily. Patient reports taking twice daily     Ergocalciferol (VITAMIN D2 PO) Take 2,000 Units by mouth daily.      FIBER PO Take 2 each by mouth. Fiber well gummies 5 gram each     Omega-3 Fatty Acids (OMEGA-3 EPA FISH OIL PO) Take by mouth.     TURMERIC CURCUMIN PO Take by mouth.     Turmeric, Curcuma Longa, (CURCUMIN) POWD by miscellaneous route.     No current facility-administered medications for this visit.     ASSESSMENT & PLAN:   Assessment & Plan: Kerri Garza is a 83 y.o. female with low-grade marginal zone B-cell lymphoma.  Her labs are stable and she otherwise does not have evidence of progressive disease.  She will continue to have labs drawn every 2 months Dr. Jefferey or our office.  Her systolic blood pressure is elevated, so she will start checking her blood pressure at home again and let Dr. Jefferey know if it remains elevated.  We will plan to see her back in 4 months with a CBC and comprehensive metabolic panel for repeat clinical assessment.  The patient understands the plans discussed today and is in agreement with them.  She knows to contact our office if she develops concerns prior to her next appointment.     I provided 20 minutes of face-to-face time  during this encounter and > 50% was spent counseling as documented under my assessment and plan.    Tahmir Kleckner A Jr Milliron, PA-C  [facility] [address] [place] [address] Dept: [phone] Dept Fax: [phone]   Orders Placed This Encounter  Procedures   CBC with Differential (Cancer Center Only)    Standing Status:   Future    Number of Occurrences:   1  Expected Date:   02/12/2024    Expiration Date:   05/12/2024   CMP (Cancer Center only)    Standing Status:   Future    Number of Occurrences:   1    Expected Date:   02/12/2024    Expiration Date:   05/12/2024

## 2024-04-14 DIAGNOSIS — E1169 Type 2 diabetes mellitus with other specified complication: Secondary | ICD-10-CM | POA: Diagnosis not present

## 2024-04-14 DIAGNOSIS — D72829 Elevated white blood cell count, unspecified: Secondary | ICD-10-CM | POA: Diagnosis not present

## 2024-04-16 DIAGNOSIS — D72829 Elevated white blood cell count, unspecified: Secondary | ICD-10-CM | POA: Diagnosis not present

## 2024-04-16 DIAGNOSIS — R5383 Other fatigue: Secondary | ICD-10-CM | POA: Diagnosis not present

## 2024-04-16 DIAGNOSIS — R42 Dizziness and giddiness: Secondary | ICD-10-CM | POA: Diagnosis not present

## 2024-04-16 DIAGNOSIS — Z6833 Body mass index (BMI) 33.0-33.9, adult: Secondary | ICD-10-CM | POA: Diagnosis not present

## 2024-04-16 DIAGNOSIS — R600 Localized edema: Secondary | ICD-10-CM | POA: Diagnosis not present

## 2024-04-16 DIAGNOSIS — M4726 Other spondylosis with radiculopathy, lumbar region: Secondary | ICD-10-CM | POA: Diagnosis not present

## 2024-04-16 DIAGNOSIS — R7301 Impaired fasting glucose: Secondary | ICD-10-CM | POA: Diagnosis not present

## 2024-06-17 ENCOUNTER — Inpatient Hospital Stay

## 2024-06-17 ENCOUNTER — Inpatient Hospital Stay: Admitting: Hematology and Oncology

## 2024-06-24 ENCOUNTER — Telehealth: Payer: Self-pay | Admitting: Oncology

## 2024-06-24 ENCOUNTER — Inpatient Hospital Stay: Admitting: Hematology and Oncology

## 2024-06-24 ENCOUNTER — Other Ambulatory Visit: Payer: Self-pay

## 2024-06-24 ENCOUNTER — Inpatient Hospital Stay: Attending: Hematology and Oncology

## 2024-06-24 ENCOUNTER — Other Ambulatory Visit: Payer: Self-pay | Admitting: Hematology and Oncology

## 2024-06-24 VITALS — BP 183/54 | HR 57 | Resp 16 | Ht 62.0 in | Wt 181.2 lb

## 2024-06-24 DIAGNOSIS — C8300 Small cell B-cell lymphoma, unspecified site: Secondary | ICD-10-CM | POA: Insufficient documentation

## 2024-06-24 DIAGNOSIS — C884 Extranodal marginal zone b-cell lymphoma of mucosa-associated lymphoid tissue (malt-lymphoma) not having achieved remission: Secondary | ICD-10-CM | POA: Diagnosis not present

## 2024-06-24 DIAGNOSIS — C8510 Unspecified B-cell lymphoma, unspecified site: Secondary | ICD-10-CM

## 2024-06-24 LAB — CMP (CANCER CENTER ONLY)
ALT: 15 U/L (ref 0–44)
AST: 18 U/L (ref 15–41)
Albumin: 4.5 g/dL (ref 3.5–5.0)
Alkaline Phosphatase: 56 U/L (ref 38–126)
Anion gap: 12 (ref 5–15)
BUN: 26 mg/dL — ABNORMAL HIGH (ref 8–23)
CO2: 26 mmol/L (ref 22–32)
Calcium: 9.7 mg/dL (ref 8.9–10.3)
Chloride: 101 mmol/L (ref 98–111)
Creatinine: 1.05 mg/dL — ABNORMAL HIGH (ref 0.44–1.00)
GFR, Estimated: 52 mL/min — ABNORMAL LOW (ref >60)
Glucose, Bld: 105 mg/dL — ABNORMAL HIGH (ref 70–99)
Potassium: 4.8 mmol/L (ref 3.5–5.1)
Sodium: 138 mmol/L (ref 135–145)
Total Bilirubin: 0.4 mg/dL (ref 0.0–1.2)
Total Protein: 7.1 g/dL (ref 6.5–8.1)

## 2024-06-24 LAB — CBC WITH DIFFERENTIAL (CANCER CENTER ONLY)
Abs Immature Granulocytes: 0.26 K/uL — ABNORMAL HIGH (ref 0.00–0.07)
Basophils Absolute: 0 K/uL (ref 0.0–0.1)
Basophils Relative: 0 %
Eosinophils Absolute: 0.1 K/uL (ref 0.0–0.5)
Eosinophils Relative: 1 %
HCT: 38.1 % (ref 36.0–46.0)
Hemoglobin: 12.3 g/dL (ref 12.0–15.0)
Immature Granulocytes: 2 %
Lymphocytes Relative: 70 %
Lymphs Abs: 9.7 K/uL — ABNORMAL HIGH (ref 0.7–4.0)
MCH: 30.3 pg (ref 26.0–34.0)
MCHC: 32.3 g/dL (ref 30.0–36.0)
MCV: 93.8 fL (ref 80.0–100.0)
Monocytes Absolute: 1.1 K/uL — ABNORMAL HIGH (ref 0.1–1.0)
Monocytes Relative: 8 %
Neutro Abs: 2.6 K/uL (ref 1.7–7.7)
Neutrophils Relative %: 19 %
Platelet Count: 148 K/uL — ABNORMAL LOW (ref 150–400)
RBC: 4.06 MIL/uL (ref 3.87–5.11)
RDW: 13.7 % (ref 11.5–15.5)
Smear Review: NORMAL
WBC Count: 13.9 K/uL — ABNORMAL HIGH (ref 4.0–10.5)
nRBC: 0 % (ref 0.0–0.2)

## 2024-06-24 NOTE — Progress Notes (Unsigned)
 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 She originally saw Dr. Ezzard in April 2016 for leukocytosis, which was predominantly lymphocytes, dating back to December 2015. Her white count ranged from 11,900 to 19,800.  Flow cytometry of the peripheral blood revealed a monoclonal B-cell population consistent with marginal zone lymphoma, involving 54% of the cells.  This was positive for CD 19, CD 20, CD 21, CD 22, HLA DR, CD 11, CD 25, FLT 7, and lambda light chains and negative for CD 103.  Regular follow-up was not recommended, as she did not have significant lymphocytosis, or any associated B symptoms, splenomegaly, or adenopathy.  She did not follow-up and was referred back and July 2017, at which time she saw Dr. Cornelius.  Her white count was 19,200 with 34% neutrophils and 62% lymphocytes.  Serum protein electrophoresis was normal.  Quantitative immunoglobulins were normal.  She remained stable, so routine follow-up was recommend. She was seen again in July 2018, her white count was 19,500 with 25% neutrophils and 75% lymphocytes.  Dr. Cornelius recommended annual follow-up at that time.  She was seen in August 2019, at which time her white count had normalized at 6600 with 65% neutrophils, 19% lymphocytes, 10% monocytes and 4% eosinophils.  As that was the case, we recommended follow-up with Dr. Jefferey with referral back as needed.  She has never had associated anemia, thrombocytopenia or splenomegaly.  EGD and colonoscopy  with Dr. 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  CT abdomen and pelvis in January 2020 did not reveal any lymphadenopathy or splenomegaly.  She was referred back by Dr. Fitch Jefferey in April 2024, as her lymphocytosis had worsened. We recommended labs every 2 months and follow up every 4 months.  In our office in February, WBCs were 11.2 was 23% neutrophils, 65% lymphocytes, 10% monocytes and 2% eosinophils.  Hemoglobin 12.7 with an MCV of 92.  Platelets 158,000.  At Dr. Ardis office in April WBCs were 12.3, 25% neutrophils, 62% lymphocytes, 11% monocytes and 3% eosinophils.  Hemoglobin 12.3.  Platelets 170,000.  INTERVAL HISTORY:  Kerri Garza is here today for repeat clinical assessment.  She states she is feeling a little off today, but she is fasting.  She also does not like the heat.  She denies any specific symptoms.  She denies fatigue.  She denies fevers, chills or night sweats. She denies pain. Her appetite is good. Her weight has decreased 5 pounds over last 4 months.  REVIEW OF SYSTEMS:  Review of Systems  Constitutional:  Negative for appetite change, chills, diaphoresis, fatigue, fever and unexpected weight change.  HENT:   Negative for lump/mass, mouth sores, nosebleeds and sore throat.   Respiratory:  Negative for cough, hemoptysis and shortness of breath.   Cardiovascular:  Negative for chest pain and leg swelling.  Gastrointestinal:  Negative for abdominal pain, blood in stool, constipation, diarrhea, nausea and vomiting.  Endocrine: Negative for hot flashes.  Genitourinary:  Negative for  difficulty urinating, dysuria, frequency, hematuria and vaginal bleeding.   Musculoskeletal:  Negative for arthralgias, back pain, gait problem and myalgias.  Skin:  Negative for rash.  Neurological:  Positive for numbness (neuropathy of hands). Negative for dizziness,  extremity weakness, gait problem, headaches and light-headedness.  Hematological:  Negative for adenopathy. Does not bruise/bleed easily.  Psychiatric/Behavioral:  Negative for depression and sleep disturbance. The patient is not nervous/anxious.      VITALS:  There were no vitals taken for this visit.  Wt Readings from Last 3 Encounters:  02/12/24 183 lb 11.2 oz (83.3 kg)  10/15/23 188 lb 3.2 oz (85.4 kg)  05/29/23 181 lb 12.8 oz (82.5 kg)    There is no height or weight on file to calculate BMI.  Performance status (ECOG): 1 - Symptomatic but completely ambulatory  PHYSICAL EXAM:  Physical Exam Vitals and nursing note reviewed.  Constitutional:      General: She is not in acute distress.    Appearance: Normal appearance.  HENT:     Head: Normocephalic and atraumatic.     Mouth/Throat:     Mouth: Mucous membranes are moist.     Pharynx: Oropharynx is clear. No oropharyngeal exudate or posterior oropharyngeal erythema.  Eyes:     General: No scleral icterus.    Extraocular Movements: Extraocular movements intact.     Conjunctiva/sclera: Conjunctivae normal.     Pupils: Pupils are equal, round, and reactive to light.  Cardiovascular:     Rate and Rhythm: Normal rate and regular rhythm.     Heart sounds: Normal heart sounds. No murmur heard.    No friction rub. No gallop.  Pulmonary:     Effort: Pulmonary effort is normal.     Breath sounds: Normal breath sounds. No wheezing, rhonchi or rales.  Abdominal:     General: There is no distension.     Palpations: Abdomen is soft. There is no hepatomegaly, splenomegaly or mass.     Tenderness: There is no abdominal tenderness.  Musculoskeletal:        General: Normal range of motion.     Cervical back: Normal range of motion and neck supple. No tenderness.     Right lower leg: No edema.     Left lower leg: No edema.  Lymphadenopathy:     Cervical: No cervical adenopathy.     Upper Body:     Right upper body: No  supraclavicular or axillary adenopathy.     Left upper body: No supraclavicular or axillary adenopathy.     Lower Body: No right inguinal adenopathy. No left inguinal adenopathy.  Skin:    General: Skin is warm and dry.     Coloration: Skin is not jaundiced.     Findings: No rash.  Neurological:     Mental Status: She is alert and oriented to person, place, and time.     Cranial Nerves: No cranial nerve deficit.  Psychiatric:        Mood and Affect: Mood normal.        Behavior: Behavior normal.        Thought Content: Thought content normal.     LABS:      Latest Ref Rng & Units 02/12/2024    1:43 PM 10/15/2023   12:39 PM 08/02/2023   12:00 AM  CBC  WBC 4.0 - 10.5 K/uL 12.5  11.2  12.1      Hemoglobin 12.0 - 15.0 g/dL 88.2  87.2  12.7  Hematocrit 36.0 - 46.0 % 36.0  38.5  39      Platelets 150 - 400 K/uL 139  158  196         This result is from an external source.    Latest Reference Range & Units 02/12/24 13:43  Neutrophils % 23  Lymphocytes % 63  Monocytes Relative % 9  Eosinophil % 1  Basophil % 0  Immature Granulocytes % 4  NEUT# 1.7 - 7.7 K/uL 2.9  Lymphs Abs 0.7 - 4.0 K/uL 7.9 (H)  Monocyte # 0.1 - 1.0 K/uL 1.1 (H)  Eosinophils Absolute 0.0 - 0.5 K/uL 0.1  Basophils Absolute 0.0 - 0.1 K/uL 0.0  Abs Immature Granulocytes 0.00 - 0.07 K/uL 0.50 (H)  RBC Morphology  MORPHOLOGY UNREMARKABLE  WBC Morphology  Abnormal lymphocytes present  Smear Review  Normal platelet morphology  (H): Data is abnormally high    Latest Ref Rng & Units 02/12/2024    1:43 PM 10/15/2023   12:39 PM 05/29/2023    2:54 PM  CMP  Glucose 70 - 99 mg/dL 90  882    BUN 8 - 23 mg/dL 26  20  26       Creatinine 0.44 - 1.00 mg/dL 8.98  8.98  0.9      Sodium 135 - 145 mmol/L 135  140  136      Potassium 3.5 - 5.1 mmol/L 5.0  4.5  4.6      Chloride 98 - 111 mmol/L 102  106  102      CO2 22 - 32 mmol/L 22  23  28       Calcium 8.9 - 10.3 mg/dL 9.3  9.7  9.6      Total Protein 6.5 - 8.1 g/dL  6.8  7.4    Total Bilirubin 0.0 - 1.2 mg/dL 0.3  0.3    Alkaline Phos 38 - 126 U/L 57  62  66      AST 15 - 41 U/L 13  14  24       ALT 0 - 44 U/L 8  7  17          This result is from an external source.    Lab Results  Component Value Date   LDH 158 12/15/2022    STUDIES:  No results found.    HISTORY:   Past Medical History:  Diagnosis Date  . Anxiety   . Aortic atherosclerosis   . Bradycardia   . Cerebral vascular disease   . Degenerative disc disease, lumbar   . Depression   . Diabetes mellitus without complication (HCC)   . Diverticulitis   . Essential tremor   . Hyperlipidemia   . Hypertension   . Ischemic colitis   . Lymphoma (HCC)    she was diagnosed several years ago for about 2.5 years but then labs were completely normal since then. Received no treatment.  . Marginal zone lymphoma (HCC)   . Nocturnal hypoxia   . Osteoarthritis   . Retinal detachment    bilateral  . Rosacea   . Skin cancer   . Vitamin D deficiency     Past Surgical History:  Procedure Laterality Date  . CATARACT EXTRACTION Bilateral   . DILATION AND CURETTAGE OF UTERUS    . RETINAL DETACHMENT SURGERY Bilateral   . TONSILLECTOMY  1947  . TONSILLECTOMY AND ADENOIDECTOMY      Family History  Problem Relation Age of Onset  . High blood pressure Mother   .  Stroke Mother   . Parkinson's disease Father   . Dementia Maternal Grandmother   . Cancer Maternal Grandfather        thinks it was pancreatic  . Heart disease Paternal Grandmother   . Suicidality Paternal Grandfather     Social History:  reports that she has never smoked. She has never used smokeless tobacco. She reports that she does not currently use alcohol . She reports that she does not use drugs.The patient is alone today.  Allergies:  Allergies  Allergen Reactions  . Bactrim Ds [Sulfamethoxazole-Trimethoprim]     Irritability & lip swelling  . Lisinopril     URI symptoms  . Losartan Potassium     Fingers felt  numb  . Metoprolol     Made her tired  . Moxifloxacin Other (See Comments)    Eye irritation  Eye irritation  Eye irritation  . Propranolol     Hair loss  . Ranitidine Hcl     Hair loss    Current Medications: Current Outpatient Medications  Medication Sig Dispense Refill  . acetaminophen (TYLENOL 8 HOUR ARTHRITIS PAIN) 650 MG CR tablet Take 650 mg by mouth every 8 (eight) hours as needed for pain.    SABRA acidophilus (RISAQUAD) CAPS capsule Take 1 capsule by mouth every evening.    SABRA aspirin 325 MG tablet Take 325 mg by mouth daily. Patient reports taking twice daily    . Ergocalciferol (VITAMIN D2 PO) Take 2,000 Units by mouth daily.     SABRA FIBER PO Take 2 each by mouth. Fiber well gummies 5 gram each    . nystatin (MYCOSTATIN/NYSTOP) powder Apply 1 Application topically 2 (two) times daily.    . Omega-3 Fatty Acids (OMEGA-3 EPA FISH OIL PO) Take by mouth.    . TURMERIC CURCUMIN PO Take by mouth.    . Turmeric, Curcuma Longa, (CURCUMIN) POWD by miscellaneous route.     No current facility-administered medications for this visit.     ASSESSMENT & PLAN:   Assessment & Plan: MYKALA MCCREADY is a 83 y.o. female with low-grade marginal zone B-cell lymphoma.  Her labs are stable and she otherwise does not have evidence of progressive disease.  She will continue to have labs drawn every 2 months Dr. Jefferey or our office.  Her systolic blood pressure is elevated, so she will start checking her blood pressure at home again and let Dr. Jefferey know if it remains elevated.  We will plan to see her back in 4 months with a CBC and comprehensive metabolic panel for repeat clinical assessment.  The patient understands the plans discussed today and is in agreement with them.  She knows to contact our office if she develops concerns prior to her next appointment.     I provided 20 minutes of face-to-face time during this encounter and > 50% was spent counseling as documented under my assessment and  plan.    Eleanor DELENA Bach, NP  [facility]. [facility] [address] [place] [address] Dept: [phone] Dept Fax: [phone]   No orders of the defined types were placed in this encounter.

## 2024-06-24 NOTE — Telephone Encounter (Signed)
 Patient has been scheduled for follow-up visit per 09/04/23 LOS.  Pt given an appt calendar with date and time.

## 2024-07-21 DEATH — deceased

## 2024-10-22 ENCOUNTER — Inpatient Hospital Stay

## 2024-10-22 ENCOUNTER — Inpatient Hospital Stay: Admitting: Oncology
